# Patient Record
Sex: Female | Born: 1975
Health system: Southern US, Community
[De-identification: ages and names within clinical notes are randomized; demographics above are authoritative.]

## PROBLEM LIST (undated history)

## (undated) DIAGNOSIS — K589 Irritable bowel syndrome without diarrhea: Secondary | ICD-10-CM

## (undated) DIAGNOSIS — F329 Major depressive disorder, single episode, unspecified: Secondary | ICD-10-CM

## (undated) DIAGNOSIS — F32A Depression, unspecified: Secondary | ICD-10-CM

## (undated) DIAGNOSIS — J45909 Unspecified asthma, uncomplicated: Secondary | ICD-10-CM

## (undated) DIAGNOSIS — N189 Chronic kidney disease, unspecified: Secondary | ICD-10-CM

## (undated) DIAGNOSIS — K439 Ventral hernia without obstruction or gangrene: Secondary | ICD-10-CM

## (undated) DIAGNOSIS — R7303 Prediabetes: Secondary | ICD-10-CM

## (undated) DIAGNOSIS — Z87442 Personal history of urinary calculi: Secondary | ICD-10-CM

## (undated) DIAGNOSIS — E739 Lactose intolerance, unspecified: Secondary | ICD-10-CM

## (undated) HISTORY — PX: CHOLECYSTECTOMY: SHX55

## (undated) HISTORY — PX: LAPAROTOMY: SHX154

## (undated) HISTORY — DX: Irritable bowel syndrome without diarrhea: K58.9

## (undated) HISTORY — DX: Unspecified asthma, uncomplicated: J45.909

## (undated) HISTORY — DX: Lactose intolerance, unspecified: E73.9

---

## 2004-09-11 ENCOUNTER — Emergency Department (HOSPITAL_COMMUNITY): Admission: EM | Admit: 2004-09-11 | Discharge: 2004-09-11 | Payer: Self-pay | Admitting: Emergency Medicine

## 2014-06-17 ENCOUNTER — Other Ambulatory Visit (INDEPENDENT_AMBULATORY_CARE_PROVIDER_SITE_OTHER): Payer: Self-pay

## 2014-06-17 DIAGNOSIS — R1084 Generalized abdominal pain: Secondary | ICD-10-CM

## 2014-06-17 DIAGNOSIS — K432 Incisional hernia without obstruction or gangrene: Secondary | ICD-10-CM

## 2014-06-22 ENCOUNTER — Other Ambulatory Visit: Payer: Self-pay

## 2014-06-23 ENCOUNTER — Other Ambulatory Visit: Payer: Self-pay

## 2014-07-10 ENCOUNTER — Ambulatory Visit
Admission: RE | Admit: 2014-07-10 | Discharge: 2014-07-10 | Disposition: A | Discharge status: Home / Self Care | Payer: 59 | Source: Ambulatory Visit | Attending: General Surgery | Admitting: General Surgery

## 2014-07-10 DIAGNOSIS — R1084 Generalized abdominal pain: Secondary | ICD-10-CM

## 2014-07-10 DIAGNOSIS — K432 Incisional hernia without obstruction or gangrene: Secondary | ICD-10-CM

## 2014-07-10 MED ORDER — IOHEXOL 300 MG/ML  SOLN
125.0000 mL | Freq: Once | INTRAMUSCULAR | Status: AC | PRN
  Administered 2014-07-10: 125 mL via INTRAVENOUS

## 2014-07-30 ENCOUNTER — Other Ambulatory Visit (INDEPENDENT_AMBULATORY_CARE_PROVIDER_SITE_OTHER): Payer: Self-pay | Admitting: General Surgery

## 2014-07-30 ENCOUNTER — Other Ambulatory Visit: Payer: Self-pay | Admitting: General Surgery

## 2014-07-30 DIAGNOSIS — K219 Gastro-esophageal reflux disease without esophagitis: Secondary | ICD-10-CM

## 2014-07-30 DIAGNOSIS — R030 Elevated blood-pressure reading, without diagnosis of hypertension: Secondary | ICD-10-CM

## 2014-07-30 DIAGNOSIS — IMO0001 Reserved for inherently not codable concepts without codable children: Secondary | ICD-10-CM

## 2014-07-30 LAB — CBC WITH DIFFERENTIAL/PLATELET
BASOS ABS: 0 10*3/uL (ref 0.0–0.1)
Basophils Relative: 0 % (ref 0–1)
EOS PCT: 3 % (ref 0–5)
Eosinophils Absolute: 0.3 10*3/uL (ref 0.0–0.7)
HCT: 36.6 % (ref 36.0–46.0)
Hemoglobin: 12 g/dL (ref 12.0–15.0)
LYMPHS ABS: 3.4 10*3/uL (ref 0.7–4.0)
LYMPHS PCT: 36 % (ref 12–46)
MCH: 28.2 pg (ref 26.0–34.0)
MCHC: 32.8 g/dL (ref 30.0–36.0)
MCV: 86.1 fL (ref 78.0–100.0)
MONO ABS: 1 10*3/uL (ref 0.1–1.0)
MPV: 9 fL (ref 8.6–12.4)
Monocytes Relative: 10 % (ref 3–12)
Neutro Abs: 4.8 10*3/uL (ref 1.7–7.7)
Neutrophils Relative %: 51 % (ref 43–77)
Platelets: 377 10*3/uL (ref 150–400)
RBC: 4.25 MIL/uL (ref 3.87–5.11)
RDW: 14.7 % (ref 11.5–15.5)
WBC: 9.5 10*3/uL (ref 4.0–10.5)

## 2014-08-03 LAB — VITAMIN D 1,25 DIHYDROXY
VITAMIN D 1, 25 (OH) TOTAL: 31 pg/mL (ref 18–72)
Vitamin D2 1, 25 (OH)2: <8 pg/mL
Vitamin D3 1, 25 (OH)2: 31 pg/mL

## 2014-08-03 LAB — GLYCOHEMOGLOBIN, TOTAL: Glycohemoglobin (GHb),Total: 6.9 % (ref 5.4–7.4)

## 2014-08-04 ENCOUNTER — Other Ambulatory Visit: Payer: Self-pay

## 2014-08-06 LAB — H PYLORI, IGM, IGG, IGA AB

## 2014-08-24 ENCOUNTER — Encounter: Payer: Self-pay | Admitting: Dietician

## 2014-08-24 ENCOUNTER — Encounter: Payer: 59 | Attending: General Surgery | Admitting: Dietician

## 2014-08-24 DIAGNOSIS — Z6841 Body Mass Index (BMI) 40.0 and over, adult: Secondary | ICD-10-CM | POA: Insufficient documentation

## 2014-08-24 DIAGNOSIS — Z713 Dietary counseling and surveillance: Secondary | ICD-10-CM | POA: Insufficient documentation

## 2014-08-24 NOTE — Patient Instructions (Signed)

## 2014-08-24 NOTE — Progress Notes (Signed)
  Pre-Op Assessment Visit:  Pre-Operative Sleeve Gastrectomy Surgery  Medical Nutrition Therapy:  Appt start time: 0800   End time:  0845.  Patient was seen on 08/24/2014 for Pre-Operative Nutrition Assessment. Assessment and [facility] on 08/24/2014.   Preferred Learning Style:   No preference indicated   Learning Readiness:   Ready  Handouts given during visit include:  Pre-Op Goals Bariatric Surgery Protein Shakes   During the appointment today the following Pre-Op Goals were reviewed with the patient: Maintain or lose weight as instructed by your surgeon Make healthy food choices Begin to limit portion sizes Limited concentrated sugars and fried foods Keep fat/sugar in the single digits per serving on   food labels Practice CHEWING your food  (aim for 30 chews per bite or until applesauce consistency) Practice not drinking 15 minutes before, during, and 30 minutes after each meal/snack Avoid all carbonated beverages  Avoid/limit caffeinated beverages  Avoid all sugar-sweetened beverages Consume 3 meals per day; eat every 3-5 hours Make a list of non-food related activities Aim for 64-100 ounces of FLUID daily  Aim for at least 60-80 grams of PROTEIN daily Look for a liquid protein source that contain ?15 g protein and ?5 g carbohydrate  (ex: shakes, drinks, shots)  Patient-Centered Goals: Leslie Cohen would like to have less foot pain, have more energy, and be more comfortable in airplane and on rollercoasters.  Leslie Cohen feels like it is very important to follow the pre op goals and feels very confident that she will be able to follow the pre op goals.  Demonstrated degree of understanding via:  Teach Back  Teaching Method Utilized:  Visual Auditory Hands on  Barriers to learning/adherence to lifestyle change: IBS, lactose intolerance  Patient to call the [facility] to enroll in Pre-Op and Post-Op Nutrition Education when surgery date is scheduled.

## 2014-09-07 ENCOUNTER — Ambulatory Visit (HOSPITAL_COMMUNITY)
Admission: RE | Admit: 2014-09-07 | Discharge: 2014-09-07 | Disposition: A | Discharge status: Home / Self Care | Payer: 59 | Source: Ambulatory Visit | Attending: General Surgery | Admitting: General Surgery

## 2014-09-07 ENCOUNTER — Other Ambulatory Visit: Payer: Self-pay

## 2014-09-07 DIAGNOSIS — N200 Calculus of kidney: Secondary | ICD-10-CM | POA: Insufficient documentation

## 2014-09-07 DIAGNOSIS — J45909 Unspecified asthma, uncomplicated: Secondary | ICD-10-CM | POA: Insufficient documentation

## 2014-09-07 DIAGNOSIS — E739 Lactose intolerance, unspecified: Secondary | ICD-10-CM | POA: Insufficient documentation

## 2014-09-07 DIAGNOSIS — K589 Irritable bowel syndrome without diarrhea: Secondary | ICD-10-CM | POA: Diagnosis not present

## 2014-09-07 DIAGNOSIS — Z6841 Body Mass Index (BMI) 40.0 and over, adult: Secondary | ICD-10-CM | POA: Insufficient documentation

## 2014-09-07 DIAGNOSIS — Z9049 Acquired absence of other specified parts of digestive tract: Secondary | ICD-10-CM | POA: Diagnosis not present

## 2014-09-07 DIAGNOSIS — K219 Gastro-esophageal reflux disease without esophagitis: Secondary | ICD-10-CM

## 2014-10-06 ENCOUNTER — Ambulatory Visit: Payer: Self-pay | Admitting: General Surgery

## 2014-10-19 ENCOUNTER — Encounter: Payer: 59 | Attending: General Surgery

## 2014-10-19 DIAGNOSIS — Z713 Dietary counseling and surveillance: Secondary | ICD-10-CM | POA: Diagnosis not present

## 2014-10-19 DIAGNOSIS — Z6841 Body Mass Index (BMI) 40.0 and over, adult: Secondary | ICD-10-CM | POA: Diagnosis not present

## 2014-10-20 NOTE — Progress Notes (Signed)
  Pre-Operative Nutrition Class:  Appt start time: 830   End time:  930.  Patient was seen on 10/19/14 for Pre-Operative Bariatric Surgery Education at the [facility].   Surgery date: 11/02/14 Surgery type: Gastric sleeve Start weight at [facility]: 243.5 lbs on 08/24/14 Weight today: 241.5 lbs  TANITA  BODY COMP RESULTS  10/19/14   BMI (kg/m^2) 45.6   Fat Mass (lbs) 121.5   Fat Free Mass (lbs) 120   Total Body Water (lbs) 88   Samples given per MNT protocol. Patient educated on appropriate usage: PB2 (chocolate - qty 1) Lot #: None Exp: 11/16  Premier protein shake (strawberry - qty 1) Lot #: [id number] Exp: 9/16  Bariatric Advantage calcium citrate chew (caramel - qty 1) Lot #: [id number] Exp: 5/16  The following the learning objectives were met by the patient during this course:  Identify Pre-Op Dietary Goals and will begin 2 weeks pre-operatively  Identify appropriate sources of fluids and proteins   State protein recommendations and appropriate sources pre and post-operatively  Identify Post-Operative Dietary Goals and will follow for 2 weeks post-operatively  Identify appropriate multivitamin and calcium sources  Describe the need for physical activity post-operatively and will follow MD recommendations  State when to call healthcare provider regarding medication questions or post-operative complications  Handouts given during class include:  Pre-Op Bariatric Surgery Diet Handout  Protein Shake Handout  Post-Op Bariatric Surgery Nutrition Handout  BELT Program Information Flyer  Support Group Information Flyer  [facility] Bariatric Supplements Price List  Follow-Up Plan: Patient will follow-up at [facility] 2 weeks post operatively for diet advancement per MD.

## 2014-10-23 ENCOUNTER — Encounter (HOSPITAL_COMMUNITY): Payer: Self-pay

## 2014-10-23 ENCOUNTER — Encounter (HOSPITAL_COMMUNITY)
Admission: RE | Admit: 2014-10-23 | Discharge: 2014-10-23 | Disposition: A | Discharge status: Home / Self Care | Payer: 59 | Source: Ambulatory Visit | Attending: General Surgery | Admitting: General Surgery

## 2014-10-23 DIAGNOSIS — Z01818 Encounter for other preprocedural examination: Secondary | ICD-10-CM | POA: Insufficient documentation

## 2014-10-23 HISTORY — DX: Depression, unspecified: F32.A

## 2014-10-23 HISTORY — DX: Chronic kidney disease, unspecified: N18.9

## 2014-10-23 HISTORY — DX: Major depressive disorder, single episode, unspecified: F32.9

## 2014-10-23 LAB — CBC WITH DIFFERENTIAL/PLATELET
Basophils Absolute: 0 10*3/uL (ref 0.0–0.1)
Basophils Relative: 0 % (ref 0–1)
Eosinophils Absolute: 0.3 10*3/uL (ref 0.0–0.7)
Eosinophils Relative: 3 % (ref 0–5)
HEMATOCRIT: 36 % (ref 36.0–46.0)
HEMOGLOBIN: 11.7 g/dL — AB (ref 12.0–15.0)
LYMPHS ABS: 2.7 10*3/uL (ref 0.7–4.0)
Lymphocytes Relative: 30 % (ref 12–46)
MCH: 27.9 pg (ref 26.0–34.0)
MCHC: 32.5 g/dL (ref 30.0–36.0)
MCV: 85.9 fL (ref 78.0–100.0)
Monocytes Absolute: 0.8 10*3/uL (ref 0.1–1.0)
Monocytes Relative: 9 % (ref 3–12)
Neutro Abs: 5.3 10*3/uL (ref 1.7–7.7)
Neutrophils Relative %: 58 % (ref 43–77)
Platelets: 350 10*3/uL (ref 150–400)
RBC: 4.19 MIL/uL (ref 3.87–5.11)
RDW: 13 % (ref 11.5–15.5)
WBC: 9 10*3/uL (ref 4.0–10.5)

## 2014-10-23 LAB — COMPREHENSIVE METABOLIC PANEL
ALK PHOS: 84 U/L (ref 39–117)
ALT: 17 U/L (ref 0–35)
ANION GAP: 6 (ref 5–15)
AST: 19 U/L (ref 0–37)
Albumin: 3.7 g/dL (ref 3.5–5.2)
BUN: 15 mg/dL (ref 6–23)
CO2: 26 mmol/L (ref 19–32)
Calcium: 8.5 mg/dL (ref 8.4–10.5)
Chloride: 105 mmol/L (ref 96–112)
Creatinine, Ser: 0.55 mg/dL (ref 0.50–1.10)
GFR calc Af Amer: >90 mL/min (ref >90)
GFR calc non Af Amer: >90 mL/min (ref >90)
Glucose, Bld: 101 mg/dL — ABNORMAL HIGH (ref 70–99)
Potassium: 3.5 mmol/L (ref 3.5–5.1)
Sodium: 137 mmol/L (ref 135–145)
TOTAL PROTEIN: 7.2 g/dL (ref 6.0–8.3)

## 2014-10-23 LAB — HCG, SERUM, QUALITATIVE: Preg, Serum: NEGATIVE

## 2014-10-23 NOTE — Progress Notes (Signed)
CXR per epic 09/07/2014  EKG per epic 09/07/2014

## 2014-10-23 NOTE — Patient Instructions (Signed)
20 Leslie Cohen  10/23/2014   Your procedure is scheduled on:   Monday Nov 02, 2014  [facility] and follow signs to  Short Stay Center arrive at 10:30 AM.   Call this number if you have problems the morning of surgery [phone] or Presurgical Testing [phone].   Remember:  Do not eat food After Midnight but may have clear liquids till 6:30 am day of surgery then nothing by mouth.    Take these medicines the morning of surgery with A SIP OF WATER: NONE                               You may not have any metal on your body including hair pins and piercings  Do not wear jewelry, make-up, lotions, powders, prefumes or deodorant or nail polish.  Do not shave body hair  48 hours(2 days) of CHG soap use.                Do not bring valuables to the hospital. [facility] IS NOT RESPONSIBLE FOR VALUABLES.  Contacts, dentures or bridgework may not be worn into surgery.  Leave suitcase in the car. After surgery it may be brought to your room.  For patients admitted to the hospital, checkout time is 11:00 AM the day of discharge.    [facility] - Preparing for Surgery Before surgery, you can play an important role.  Because skin is not sterile, your skin needs to be as free of germs as possible.  You can reduce the number of germs on your skin by washing with CHG (chlorahexidine gluconate) soap before surgery.  CHG is an antiseptic cleaner which kills germs and bonds with the skin to continue killing germs even after washing. Please DO NOT use if you have an allergy to CHG or antibacterial soaps.  If your skin becomes reddened/irritated stop using the CHG and inform your nurse when you arrive at Short Stay. Do not shave (including legs and underarms) for at least 48 hours prior to the first CHG shower.  You may shave your face/neck. Please follow these instructions carefully:  1.  Shower with CHG Soap the night before surgery and the  morning of Surgery.  2.   If you choose to wash your hair, wash your hair first as usual with your  normal  shampoo.  3.  After you shampoo, rinse your hair and body thoroughly to remove the  shampoo.                           4.  Use CHG as you would any other liquid soap.  You can apply chg directly  to the skin and wash                       Gently with a scrungie or clean washcloth.  5.  Apply the CHG Soap to your body ONLY FROM THE NECK DOWN.   Do not use on face/ open                           Wound or open sores. Avoid contact with eyes, ears mouth and genitals (private parts).                       Wash  face,  Genitals (private parts) with your normal soap.             6.  Wash thoroughly, paying special attention to the area where your surgery  will be performed.  7.  Thoroughly rinse your body with warm water from the neck down.  8.  DO NOT shower/wash with your normal soap after using and rinsing off  the CHG Soap.                9.  Pat yourself dry with a clean towel.            10.  Wear clean pajamas.            11.  Place clean sheets on your bed the night of your first shower and do not  sleep with pets. Day of Surgery : Do not apply any lotions/deodorants the morning of surgery.  Please wear clean clothes to the hospital/surgery center.  FAILURE TO FOLLOW THESE INSTRUCTIONS MAY RESULT IN THE CANCELLATION OF YOUR SURGERY PATIENT SIGNATURE_________________________________  NURSE SIGNATURE__________________________________  ________________________________________________________________________    CLEAR LIQUID DIET   Foods Allowed                                                                     Foods Excluded  Coffee and tea, regular and decaf                             liquids that you cannot  Plain Jell-O in any flavor                                             see through such as: Fruit ices (not with fruit pulp)                                     milk, soups, orange juice  Iced  Popsicles                                    All solid food Carbonated beverages, regular and diet                                    Cranberry, grape and apple juices Sports drinks like Gatorade Lightly seasoned clear broth or consume(fat free) Sugar, honey syrup  Sample Menu Breakfast                                Lunch                                     Supper Cranberry juice  Beef broth                            Chicken broth Jell-O                                     Grape juice                           Apple juice Coffee or tea                        Jell-O                                      Popsicle                                                Coffee or tea                        Coffee or tea  _____________________________________________________________________

## 2014-11-02 ENCOUNTER — Encounter (HOSPITAL_COMMUNITY): Payer: Self-pay | Admitting: *Deleted

## 2014-11-02 ENCOUNTER — Inpatient Hospital Stay (HOSPITAL_COMMUNITY)
Admission: RE | Admit: 2014-11-02 | Discharge: 2014-11-05 | DRG: 621 | Disposition: A | Discharge status: Home / Self Care | Payer: 59 | Source: Ambulatory Visit | Attending: General Surgery | Admitting: General Surgery

## 2014-11-02 ENCOUNTER — Encounter (HOSPITAL_COMMUNITY)
Admission: RE | Disposition: A | Discharge status: Home / Self Care | Payer: Self-pay | Source: Ambulatory Visit | Attending: General Surgery

## 2014-11-02 ENCOUNTER — Inpatient Hospital Stay (HOSPITAL_COMMUNITY): Payer: 59 | Admitting: Anesthesiology

## 2014-11-02 DIAGNOSIS — R7309 Other abnormal glucose: Secondary | ICD-10-CM | POA: Diagnosis present

## 2014-11-02 DIAGNOSIS — Z79899 Other long term (current) drug therapy: Secondary | ICD-10-CM | POA: Diagnosis not present

## 2014-11-02 DIAGNOSIS — K449 Diaphragmatic hernia without obstruction or gangrene: Secondary | ICD-10-CM | POA: Diagnosis present

## 2014-11-02 DIAGNOSIS — K210 Gastro-esophageal reflux disease with esophagitis: Secondary | ICD-10-CM | POA: Diagnosis present

## 2014-11-02 DIAGNOSIS — K219 Gastro-esophageal reflux disease without esophagitis: Secondary | ICD-10-CM | POA: Diagnosis present

## 2014-11-02 DIAGNOSIS — Z6841 Body Mass Index (BMI) 40.0 and over, adult: Secondary | ICD-10-CM

## 2014-11-02 DIAGNOSIS — Z9049 Acquired absence of other specified parts of digestive tract: Secondary | ICD-10-CM | POA: Diagnosis present

## 2014-11-02 DIAGNOSIS — J45909 Unspecified asthma, uncomplicated: Secondary | ICD-10-CM | POA: Diagnosis present

## 2014-11-02 DIAGNOSIS — Z8249 Family history of ischemic heart disease and other diseases of the circulatory system: Secondary | ICD-10-CM | POA: Diagnosis not present

## 2014-11-02 DIAGNOSIS — R110 Nausea: Secondary | ICD-10-CM

## 2014-11-02 DIAGNOSIS — F329 Major depressive disorder, single episode, unspecified: Secondary | ICD-10-CM | POA: Diagnosis present

## 2014-11-02 DIAGNOSIS — K432 Incisional hernia without obstruction or gangrene: Secondary | ICD-10-CM | POA: Diagnosis present

## 2014-11-02 DIAGNOSIS — Z9884 Bariatric surgery status: Secondary | ICD-10-CM

## 2014-11-02 DIAGNOSIS — Z01812 Encounter for preprocedural laboratory examination: Secondary | ICD-10-CM

## 2014-11-02 DIAGNOSIS — Z811 Family history of alcohol abuse and dependence: Secondary | ICD-10-CM | POA: Diagnosis not present

## 2014-11-02 DIAGNOSIS — R7303 Prediabetes: Secondary | ICD-10-CM | POA: Diagnosis present

## 2014-11-02 HISTORY — PX: LAPAROSCOPIC GASTRIC SLEEVE RESECTION: SHX5895

## 2014-11-02 LAB — HEMOGLOBIN AND HEMATOCRIT, BLOOD
HEMATOCRIT: 37.9 % (ref 36.0–46.0)
HEMOGLOBIN: 12.6 g/dL (ref 12.0–15.0)

## 2014-11-02 LAB — PREGNANCY, URINE: Preg Test, Ur: NEGATIVE

## 2014-11-02 SURGERY — GASTRECTOMY, SLEEVE, LAPAROSCOPIC
Anesthesia: General | Site: Abdomen

## 2014-11-02 MED ORDER — SODIUM CHLORIDE 0.9 % IJ SOLN
INTRAMUSCULAR | Status: AC
  Filled 2014-11-02: qty 50

## 2014-11-02 MED ORDER — PROPOFOL 10 MG/ML IV BOLUS
INTRAVENOUS | Status: AC
  Filled 2014-11-02: qty 20

## 2014-11-02 MED ORDER — MORPHINE SULFATE 2 MG/ML IJ SOLN
2.0000 mg | INTRAMUSCULAR | Status: DC | PRN
  Administered 2014-11-02 (×3): 4 mg via INTRAVENOUS
  Administered 2014-11-03 (×4): 2 mg via INTRAVENOUS
  Administered 2014-11-03: 4 mg via INTRAVENOUS
  Administered 2014-11-03 (×2): 2 mg via INTRAVENOUS
  Filled 2014-11-02 (×3): qty 2
  Filled 2014-11-02: qty 1
  Filled 2014-11-02: qty 2
  Filled 2014-11-02 (×2): qty 1
  Filled 2014-11-02: qty 2

## 2014-11-02 MED ORDER — DEXAMETHASONE SODIUM PHOSPHATE 10 MG/ML IJ SOLN
INTRAMUSCULAR | Status: DC | PRN
  Administered 2014-11-02: 10 mg via INTRAVENOUS

## 2014-11-02 MED ORDER — ACETAMINOPHEN 160 MG/5ML PO SOLN
325.0000 mg | ORAL | Status: DC | PRN
Start: 2014-11-03 — End: 2014-11-05

## 2014-11-02 MED ORDER — PROMETHAZINE HCL 25 MG/ML IJ SOLN
6.2500 mg | INTRAMUSCULAR | Status: DC | PRN
  Administered 2014-11-02: 6.25 mg via INTRAVENOUS

## 2014-11-02 MED ORDER — LACTATED RINGERS IV SOLN
INTRAVENOUS | Status: DC
  Administered 2014-11-02: 16:00:00 via INTRAVENOUS
  Administered 2014-11-02: 1000 mL via INTRAVENOUS

## 2014-11-02 MED ORDER — UNJURY CHICKEN SOUP POWDER
2.0000 [oz_av] | Freq: Four times a day (QID) | ORAL | Status: DC
  Administered 2014-11-04: 2 [oz_av] via ORAL

## 2014-11-02 MED ORDER — PROMETHAZINE HCL 25 MG/ML IJ SOLN: 12.5000 mg | Freq: Four times a day (QID) | INTRAMUSCULAR | Status: DC | PRN

## 2014-11-02 MED ORDER — ONDANSETRON HCL 4 MG/2ML IJ SOLN: 4.0000 mg | Freq: Once | INTRAMUSCULAR | Status: DC | PRN

## 2014-11-02 MED ORDER — NEOSTIGMINE METHYLSULFATE 10 MG/10ML IV SOLN
INTRAVENOUS | Status: DC | PRN
  Administered 2014-11-02: 4 mg via INTRAVENOUS

## 2014-11-02 MED ORDER — UNJURY CHOCOLATE CLASSIC POWDER
2.0000 [oz_av] | Freq: Four times a day (QID) | ORAL | Status: DC
  Administered 2014-11-04: 2 [oz_av] via ORAL

## 2014-11-02 MED ORDER — HYDROMORPHONE HCL 1 MG/ML IJ SOLN: 0.5000 mg | INTRAMUSCULAR | Status: DC | PRN

## 2014-11-02 MED ORDER — ONDANSETRON HCL 4 MG/2ML IJ SOLN
INTRAMUSCULAR | Status: AC
  Filled 2014-11-02: qty 2

## 2014-11-02 MED ORDER — SUFENTANIL CITRATE 50 MCG/ML IV SOLN
INTRAVENOUS | Status: DC | PRN
  Administered 2014-11-02: 10 ug via INTRAVENOUS
  Administered 2014-11-02: 5 ug via INTRAVENOUS
  Administered 2014-11-02: 20 ug via INTRAVENOUS
  Administered 2014-11-02: 10 ug via INTRAVENOUS
  Administered 2014-11-02: 5 ug via INTRAVENOUS

## 2014-11-02 MED ORDER — PANTOPRAZOLE SODIUM 40 MG IV SOLR
40.0000 mg | Freq: Every day | INTRAVENOUS | Status: DC
  Administered 2014-11-02 – 2014-11-04 (×3): 40 mg via INTRAVENOUS
  Filled 2014-11-02 (×4): qty 40

## 2014-11-02 MED ORDER — GLYCOPYRROLATE 0.2 MG/ML IJ SOLN
INTRAMUSCULAR | Status: DC | PRN
Start: 2014-11-02 — End: 2014-11-02
  Administered 2014-11-02: 0.6 mg via INTRAVENOUS

## 2014-11-02 MED ORDER — SODIUM CHLORIDE 0.9 % IJ SOLN
INTRAMUSCULAR | Status: AC
  Filled 2014-11-02: qty 10

## 2014-11-02 MED ORDER — PROPOFOL 10 MG/ML IV BOLUS
INTRAVENOUS | Status: DC | PRN
  Administered 2014-11-02: 200 mg via INTRAVENOUS

## 2014-11-02 MED ORDER — SUFENTANIL CITRATE 50 MCG/ML IV SOLN
INTRAVENOUS | Status: AC
  Filled 2014-11-02: qty 1

## 2014-11-02 MED ORDER — LIDOCAINE HCL (CARDIAC) 20 MG/ML IV SOLN
INTRAVENOUS | Status: DC | PRN
  Administered 2014-11-02: 100 mg via INTRAVENOUS

## 2014-11-02 MED ORDER — ONDANSETRON HCL 4 MG/2ML IJ SOLN
4.0000 mg | Freq: Once | INTRAMUSCULAR | Status: AC | PRN
  Administered 2014-11-02: 4 mg via INTRAVENOUS

## 2014-11-02 MED ORDER — DEXAMETHASONE SODIUM PHOSPHATE 10 MG/ML IJ SOLN
INTRAMUSCULAR | Status: AC
  Filled 2014-11-02: qty 1

## 2014-11-02 MED ORDER — ENOXAPARIN SODIUM 30 MG/0.3ML ~~LOC~~ SOLN
30.0000 mg | Freq: Two times a day (BID) | SUBCUTANEOUS | Status: DC
  Administered 2014-11-03 – 2014-11-04 (×4): 30 mg via SUBCUTANEOUS
  Filled 2014-11-02 (×7): qty 0.3

## 2014-11-02 MED ORDER — CHLORHEXIDINE GLUCONATE 4 % EX LIQD: 60.0000 mL | Freq: Once | CUTANEOUS | Status: DC

## 2014-11-02 MED ORDER — ONDANSETRON HCL 4 MG/2ML IJ SOLN
INTRAMUSCULAR | Status: DC | PRN
Start: 2014-11-02 — End: 2014-11-02
  Administered 2014-11-02: 4 mg via INTRAVENOUS

## 2014-11-02 MED ORDER — ONDANSETRON HCL 4 MG/2ML IJ SOLN
4.0000 mg | INTRAMUSCULAR | Status: DC | PRN
  Administered 2014-11-03 – 2014-11-04 (×3): 4 mg via INTRAVENOUS
  Filled 2014-11-02 (×3): qty 2

## 2014-11-02 MED ORDER — MIDAZOLAM HCL 2 MG/2ML IJ SOLN
INTRAMUSCULAR | Status: AC
  Filled 2014-11-02: qty 2

## 2014-11-02 MED ORDER — HYDROMORPHONE HCL 2 MG/ML IJ SOLN
INTRAMUSCULAR | Status: AC
  Filled 2014-11-02: qty 1

## 2014-11-02 MED ORDER — CISATRACURIUM BESYLATE (PF) 10 MG/5ML IV SOLN
INTRAVENOUS | Status: DC | PRN
  Administered 2014-11-02: 12 mg via INTRAVENOUS
  Administered 2014-11-02: 2 mg via INTRAVENOUS
  Administered 2014-11-02: 1 mg via INTRAVENOUS

## 2014-11-02 MED ORDER — HYDROMORPHONE HCL 1 MG/ML IJ SOLN
0.2500 mg | INTRAMUSCULAR | Status: DC | PRN
  Administered 2014-11-02 (×2): 0.5 mg via INTRAVENOUS

## 2014-11-02 MED ORDER — PROMETHAZINE HCL 25 MG/ML IJ SOLN
INTRAMUSCULAR | Status: AC
  Filled 2014-11-02: qty 1

## 2014-11-02 MED ORDER — BUPIVACAINE LIPOSOME 1.3 % IJ SUSP
20.0000 mL | Freq: Once | INTRAMUSCULAR | Status: AC
  Administered 2014-11-02: 20 mL
  Filled 2014-11-02: qty 20

## 2014-11-02 MED ORDER — MIDAZOLAM HCL 5 MG/5ML IJ SOLN
INTRAMUSCULAR | Status: DC | PRN
  Administered 2014-11-02: 2 mg via INTRAVENOUS

## 2014-11-02 MED ORDER — OXYCODONE HCL 5 MG/5ML PO SOLN
5.0000 mg | ORAL | Status: DC | PRN
  Administered 2014-11-03 – 2014-11-04 (×3): 5 mg via ORAL
  Filled 2014-11-02 (×3): qty 5

## 2014-11-02 MED ORDER — UNJURY VANILLA POWDER
2.0000 [oz_av] | Freq: Four times a day (QID) | ORAL | Status: DC
  Administered 2014-11-04: 2 [oz_av] via ORAL

## 2014-11-02 MED ORDER — HEPARIN SODIUM (PORCINE) 5000 UNIT/ML IJ SOLN
5000.0000 [IU] | INTRAMUSCULAR | Status: AC
  Administered 2014-11-02: 5000 [IU] via SUBCUTANEOUS
  Filled 2014-11-02: qty 1

## 2014-11-02 MED ORDER — KCL IN DEXTROSE-NACL 20-5-0.45 MEQ/L-%-% IV SOLN
INTRAVENOUS | Status: DC
  Administered 2014-11-02 – 2014-11-03 (×2): via INTRAVENOUS
  Administered 2014-11-03: 125 mL/h via INTRAVENOUS
  Administered 2014-11-04 (×2): via INTRAVENOUS
  Administered 2014-11-05: 1000 mL via INTRAVENOUS
  Filled 2014-11-02 (×10): qty 1000

## 2014-11-02 MED ORDER — HYDROMORPHONE HCL 1 MG/ML IJ SOLN
INTRAMUSCULAR | Status: AC
  Filled 2014-11-02: qty 1

## 2014-11-02 MED ORDER — CISATRACURIUM BESYLATE 20 MG/10ML IV SOLN
INTRAVENOUS | Status: AC
  Filled 2014-11-02: qty 10

## 2014-11-02 MED ORDER — MORPHINE SULFATE 4 MG/ML IJ SOLN
INTRAMUSCULAR | Status: AC
  Filled 2014-11-02: qty 1

## 2014-11-02 MED ORDER — CEFOTETAN DISODIUM-DEXTROSE 2-2.08 GM-% IV SOLR
INTRAVENOUS | Status: AC
  Filled 2014-11-02: qty 50

## 2014-11-02 MED ORDER — ACETAMINOPHEN 160 MG/5ML PO SOLN
650.0000 mg | ORAL | Status: DC | PRN
  Administered 2014-11-04: 650 mg via ORAL
  Filled 2014-11-02: qty 20.3

## 2014-11-02 MED ORDER — LIDOCAINE HCL (CARDIAC) 20 MG/ML IV SOLN
INTRAVENOUS | Status: AC
  Filled 2014-11-02: qty 5

## 2014-11-02 MED ORDER — HYDROMORPHONE HCL 1 MG/ML IJ SOLN
INTRAMUSCULAR | Status: DC | PRN
  Administered 2014-11-02: 1 mg via INTRAVENOUS
  Administered 2014-11-02 (×2): 0.5 mg via INTRAVENOUS

## 2014-11-02 MED ORDER — SUCCINYLCHOLINE CHLORIDE 20 MG/ML IJ SOLN
INTRAMUSCULAR | Status: DC | PRN
  Administered 2014-11-02: 100 mg via INTRAVENOUS

## 2014-11-02 MED ORDER — METHOCARBAMOL 1000 MG/10ML IJ SOLN
1000.0000 mg | Freq: Three times a day (TID) | INTRAMUSCULAR | Status: DC
Start: 2014-11-02 — End: 2014-11-05
  Administered 2014-11-02 – 2014-11-04 (×6): 1000 mg via INTRAVENOUS
  Filled 2014-11-02 (×14): qty 10

## 2014-11-02 MED ORDER — EPHEDRINE SULFATE 50 MG/ML IJ SOLN
INTRAMUSCULAR | Status: AC
  Filled 2014-11-02: qty 1

## 2014-11-02 MED ORDER — CEFOTETAN DISODIUM-DEXTROSE 2-2.08 GM-% IV SOLR
2.0000 g | INTRAVENOUS | Status: AC
  Administered 2014-11-02: 2 g via INTRAVENOUS

## 2014-11-02 SURGICAL SUPPLY — 81 items
APL SRG 32X5 SNPLK LF DISP (MISCELLANEOUS) ×2
APPLICATOR COTTON TIP 6IN STRL (MISCELLANEOUS) ×4 IMPLANT
APPLIER CLIP ROT 10 11.4 M/L (STAPLE)
APR CLP MED LRG 11.4X10 (STAPLE)
BINDER ABDOMINAL 12 ML 46-62 (SOFTGOODS) ×3 IMPLANT
BLADE EXTENDED COATED 6.5IN (ELECTRODE) IMPLANT
BLADE HEX COATED 2.75 (ELECTRODE) ×4 IMPLANT
BLADE SURG SZ11 CARB STEEL (BLADE) ×4 IMPLANT
CABLE HIGH FREQUENCY MONO STRZ (ELECTRODE) IMPLANT
CHLORAPREP W/TINT 26ML (MISCELLANEOUS) ×8 IMPLANT
CLIP APPLIE ROT 10 11.4 M/L (STAPLE) IMPLANT
DEVICE SUT QUICK LOAD TK 5 (STAPLE) IMPLANT
DEVICE SUT TI-KNOT TK 5X26 (MISCELLANEOUS) IMPLANT
DEVICE SUTURE ENDOST 10MM (ENDOMECHANICALS) IMPLANT
DEVICE TI KNOT TK5 (MISCELLANEOUS)
DEVICE TROCAR PUNCTURE CLOSURE (ENDOMECHANICALS) ×4 IMPLANT
DRAPE CAMERA CLOSED 9X96 (DRAPES) ×4 IMPLANT
DRAPE LAPAROSCOPIC ABDOMINAL (DRAPES) ×4 IMPLANT
DRAPE UTILITY XL STRL (DRAPES) ×8 IMPLANT
ELECT REM PT RETURN 9FT ADLT (ELECTROSURGICAL) ×4
ELECTRODE REM PT RTRN 9FT ADLT (ELECTROSURGICAL) ×2 IMPLANT
GAUZE SPONGE 4X4 12PLY STRL (GAUZE/BANDAGES/DRESSINGS) IMPLANT
GLOVE BIOGEL M STRL SZ7.5 (GLOVE) ×4 IMPLANT
GLOVE BIOGEL PI IND STRL 7.0 (GLOVE) ×2 IMPLANT
GLOVE BIOGEL PI INDICATOR 7.0 (GLOVE) ×2
GLOVE ECLIPSE 7.5 STRL STRAW (GLOVE) ×4 IMPLANT
GLOVE INDICATOR 8.0 STRL GRN (GLOVE) ×4 IMPLANT
GOWN STRL REUS W/TWL LRG LVL3 (GOWN DISPOSABLE) ×4 IMPLANT
GOWN STRL REUS W/TWL XL LVL3 (GOWN DISPOSABLE) ×12 IMPLANT
HOVERMATT SINGLE USE (MISCELLANEOUS) ×4 IMPLANT
KIT BASIN OR (CUSTOM PROCEDURE TRAY) ×4 IMPLANT
LIQUID BAND (GAUZE/BANDAGES/DRESSINGS) ×4 IMPLANT
NDL SPNL 22GX3.5 QUINCKE BK (NEEDLE) ×1 IMPLANT
NEEDLE SPNL 22GX3.5 QUINCKE BK (NEEDLE) ×4 IMPLANT
NS IRRIG 1000ML POUR BTL (IV SOLUTION) ×4 IMPLANT
PACK GENERAL/GYN (CUSTOM PROCEDURE TRAY) ×4 IMPLANT
PACK UNIVERSAL I (CUSTOM PROCEDURE TRAY) ×4 IMPLANT
PEN SKIN MARKING BROAD (MISCELLANEOUS) ×4 IMPLANT
QUICK LOAD TK 5 (STAPLE)
RELOAD STAPLE 60 3.6 BLU REG (STAPLE) IMPLANT
RELOAD STAPLE 60 3.8 GOLD REG (STAPLE) IMPLANT
RELOAD STAPLE 60 4.1 GRN THCK (STAPLE) IMPLANT
RELOAD STAPLER BLUE 60MM (STAPLE) ×2 IMPLANT
RELOAD STAPLER GOLD 60MM (STAPLE) IMPLANT
RELOAD STAPLER GREEN 60MM (STAPLE) ×6 IMPLANT
SCISSORS LAP 5X35 DISP (ENDOMECHANICALS) IMPLANT
SCISSORS LAP 5X45 EPIX DISP (ENDOMECHANICALS) ×4 IMPLANT
SEALANT SURGICAL APPL DUAL CAN (MISCELLANEOUS) ×4 IMPLANT
SET IRRIG TUBING LAPAROSCOPIC (IRRIGATION / IRRIGATOR) ×4 IMPLANT
SHEARS CURVED HARMONIC AC 45CM (MISCELLANEOUS) ×4 IMPLANT
SLEEVE ADV FIXATION 5X100MM (TROCAR) ×8 IMPLANT
SLEEVE GASTRECTOMY 36FR VISIGI (MISCELLANEOUS) ×4 IMPLANT
SLEEVE XCEL OPT CAN 5 100 (ENDOMECHANICALS) IMPLANT
SOLUTION ANTI FOG 6CC (MISCELLANEOUS) ×4 IMPLANT
SPONGE LAP 18X18 X RAY DECT (DISPOSABLE) ×4 IMPLANT
STAPLER ECHELON BIOABSB 60 FLE (MISCELLANEOUS) ×6 IMPLANT
STAPLER ECHELON LONG 60 440 (INSTRUMENTS) IMPLANT
STAPLER RELOAD BLUE 60MM (STAPLE) ×4
STAPLER RELOAD GOLD 60MM (STAPLE)
STAPLER RELOAD GREEN 60MM (STAPLE) ×12
STAPLER VISISTAT 35W (STAPLE) ×4 IMPLANT
SUT MNCRL AB 4-0 PS2 18 (SUTURE) ×4 IMPLANT
SUT PROLENE 0 CT 2 (SUTURE) ×4 IMPLANT
SUT SURGIDAC NAB ES-9 0 48 120 (SUTURE) IMPLANT
SUT VIC AB 3-0 SH 27 (SUTURE) ×4
SUT VIC AB 3-0 SH 27X BRD (SUTURE) ×2 IMPLANT
SUT VICRYL 0 TIES 12 18 (SUTURE) ×4 IMPLANT
SYR 20CC LL (SYRINGE) ×4 IMPLANT
SYR 50ML LL SCALE MARK (SYRINGE) ×4 IMPLANT
SYR CONTROL 10ML LL (SYRINGE) ×4 IMPLANT
TOWEL OR 17X26 10 PK STRL BLUE (TOWEL DISPOSABLE) ×8 IMPLANT
TOWEL OR NON WOVEN STRL DISP B (DISPOSABLE) ×4 IMPLANT
TRAY FOLEY W/METER SILVER 14FR (SET/KITS/TRAYS/PACK) IMPLANT
TROCAR ADV FIXATION 5X100MM (TROCAR) ×4 IMPLANT
TROCAR BLADELESS 15MM (ENDOMECHANICALS) IMPLANT
TROCAR BLADELESS OPT 5 100 (ENDOMECHANICALS) ×4 IMPLANT
TUBING CONNECTING 10 (TUBING) ×3 IMPLANT
TUBING CONNECTING 10' (TUBING) ×1
TUBING ENDO SMARTCAP (MISCELLANEOUS) ×4 IMPLANT
TUBING FILTER THERMOFLATOR (ELECTROSURGICAL) ×4 IMPLANT
WATER STERILE IRR 1500ML POUR (IV SOLUTION) ×4 IMPLANT

## 2014-11-02 NOTE — H&P (Signed)
Leslie Cohen. Hussar 10/28/2014 10:56 AM Location: [facility] Patient #: [id number] DOB: [birth year] Married / Language: English / Race: White Female  History of Present Illness Leslie Hiss M. Bailie Christenbury MD; 10/28/2014 1:11 PM) Patient words: bari preop.  The patient is a 39 year old female who presents for a pre-op visit. She is currently scheduled for laparoscopic sleeve gastrectomy next week. I initially met her back in the fall in her last visit was on January 28. She had initially been referred to me for an incisional hernia. However after discussing this with her she decided to proceed weight loss surgery. She denies any changes since she was last seen. She states that she is having some tenderness around her hernia site however she denies any fever, chills, nausea, vomiting, diarrhea or constipation. The hernia site does get a little bit tender when she is near her menses. She denies any trips to the emergency room or hospital. She denies any medications.  Her preoperative chest x-ray and upper GI within normal limits. She was H. pylori negative. She was found to have a hemoglobin A1c of 6.7. Her CT scan demonstrated a fascial defect of around 4 cm x 4 cm containing loops of nonobstructed bowel.   Problem List/Past Medical Leslie Hiss Leslie Derby, MD; 10/28/2014 1:11 PM) CHRONIC ASTHMA, UNSPECIFIED ASTHMA SEVERITY, UNCOMPLICATED (301.60  F09.323) GASTROESOPHAGEAL REFLUX DISEASE, ESOPHAGITIS PRESENCE NOT SPECIFIED (530.81  K21.9) INCISIONAL HERNIA, WITHOUT OBSTRUCTION OR GANGRENE (553.21  K43.2) OBESITY, MORBID, BMI 40.0-49.9 (278.01  E66.01)  Other Problems Leslie Curry, MD; 10/28/2014 1:11 PM) Anxiety Disorder Cholelithiasis Kidney Stone Umbilical Hernia Repair Depression  Past Surgical History Leslie Curry, MD; 10/28/2014 1:11 PM) Cesarean Section - Multiple Gallbladder Surgery - Laparoscopic  Diagnostic Studies History Leslie Curry, MD; 10/28/2014 1:11  PM) Pap Smear 1-5 years ago Mammogram never Colonoscopy never  Allergies (Leslie Eversole, LPN; 5/57/3220 25:42 AM) No Known Drug Allergies12/16/2015  Medication History Leslie Curry, MD; 10/28/2014 1:11 PM) ZyrTEC Allergy (10MG Capsule, Oral daily) Active. RA [employer] (Oral daily) Active. Citalopram Hydrobromide (20MG Tablet, Oral as needed) Active. Medications Reconciled OxyCODONE HCl (5MG/5ML Solution, 5-10 Milliliter Oral every four hours, as needed, Taken starting 10/28/2014) Active.  Social History Leslie Curry, MD; 10/28/2014 1:11 PM) Alcohol use Occasional alcohol use. Caffeine use Carbonated beverages. No drug use Tobacco use Never smoker.  Family History Leslie Curry, MD; 10/28/2014 1:11 PM) Arthritis Father. Respiratory Condition Father, Mother. Depression Father. Heart disease in female family member before age 58 Hypertension Father. Alcohol Abuse Father.  Pregnancy / Birth History Leslie Curry, MD; 10/28/2014 1:11 PM) Age at menarche 28 years. Irregular periods Gravida 3 Para 3 Maternal age 62-20 Contraceptive History Oral contraceptives.  Review of Systems Leslie Curry, MD; 10/28/2014 1:06 00) General Not Present- Appetite Loss, Chills, Fatigue, Fever, Night Sweats, Weight Gain and Weight Loss. Skin Not Present- Change in Wart/Mole, Dryness, Hives, Jaundice, New Lesions, Non-Healing Wounds, Rash and Ulcer. HEENT Present- Seasonal Allergies and Wears glasses/contact lenses. Not Present- Earache, Hearing Loss, Hoarseness, Nose Bleed, Oral Ulcers, Ringing in the Ears, Sinus Pain, Sore Throat, Visual Disturbances and Yellow Eyes. Respiratory Not Present- Bloody sputum, Chronic Cough, Difficulty Breathing, Snoring and Wheezing. Breast Not Present- Breast Mass, Breast Pain, Nipple Discharge and Skin Changes. Cardiovascular Present- Leg Cramps. Not Present- Chest Pain, Difficulty Breathing Lying Down, Palpitations, Rapid  Heart Rate, Shortness of Breath and Swelling of Extremities. Gastrointestinal Not Present- Abdominal Pain, Bloating, Bloody Stool, Change in Bowel Habits, Chronic diarrhea,  Constipation, Difficulty Swallowing, Excessive gas, Gets full quickly at meals, Hemorrhoids, Indigestion, Nausea, Rectal Pain and Vomiting. Female Genitourinary Not Present- Frequency, Nocturia, Painful Urination, Pelvic Pain and Urgency. Musculoskeletal Not Present- Back Pain, Joint Pain, Joint Stiffness, Muscle Pain, Muscle Weakness and Swelling of Extremities. Neurological Not Present- Decreased Memory, Fainting, Headaches, Numbness, Seizures, Tingling, Tremor, Trouble walking and Weakness. Psychiatric Present- Anxiety and Depression. Not Present- Bipolar, Change in Sleep Pattern, Fearful and Frequent crying. Endocrine Not Present- Cold Intolerance, Excessive Hunger, Hair Changes, Heat Intolerance, Hot flashes and New Diabetes. Hematology Not Present- Easy Bruising, Excessive bleeding, Gland problems, HIV and Persistent Infections.   Vitals (Leslie Eversole LPN; 0/97/3532 99:24 AM) 10/28/2014 10:56 AM Weight: 242 lb Height: 61in Body Surface Area: 2.17 m Body Mass Index: 45.73 kg/m Temp.: 98.23F(Oral)  Pulse: 84 (Regular)  BP: 126/82 (Sitting, Left Arm, Standard)    Physical Exam Leslie Hiss M. Leslie Watterson MD; 10/28/2014 1:06 PM) General Mental Status-Alert. General Appearance-Consistent with stated age. Hydration-Well hydrated. Voice-Normal. Note: Morbidly obese   Head and Neck Head-normocephalic, atraumatic with no lesions or palpable masses. Trachea-midline. Thyroid Gland Characteristics - normal size and consistency.  Eye Eyeball - Bilateral-Extraocular movements intact. Sclera/Conjunctiva - Bilateral-No scleral icterus.  Chest and Lung Exam Chest and lung exam reveals -quiet, even and easy respiratory effort with no use of accessory muscles and on auscultation, normal breath  sounds, no adventitious sounds and normal vocal resonance. Inspection Chest Wall - Normal. Back - normal.  Breast - Did not examine.  Cardiovascular Cardiovascular examination reveals -normal heart sounds, regular rate and rhythm with no murmurs and normal pedal pulses bilaterally.  Abdomen Inspection  Inspection of the abdomen reveals: Note: She has an obvious bulge at the top of her umbilicus. This bulge extends for about 6 cm superiorly about 5 cm in width. It is a little bit challenging to determine actual dimensions given her body habitus. She was examined supine and standing. It is soft and nontender. The area of question in the left mid abdominal wall that she is concerned that she might have a mass I really don't appreciate anything of note. Skin - Scar - Note: Well-healed trocar scars in right upper quadrant. Small left subcostal incision. Palpation/Percussion Palpation and Percussion of the abdomen reveal - Soft, Non Tender, No Rebound tenderness, No Rigidity (guarding) and No hepatosplenomegaly. Auscultation Auscultation of the abdomen reveals - Bowel sounds normal.  Peripheral Vascular Upper Extremity Palpation - Pulses bilaterally normal.  Neurologic Neurologic evaluation reveals -alert and oriented x 3 with no impairment of recent or remote memory. Mental Status-Normal.  Neuropsychiatric The patient's mood and affect are described as -normal. Judgment and Insight-insight is appropriate concerning matters relevant to self.  Musculoskeletal Normal Exam - Left-Upper Extremity Strength Normal and Lower Extremity Strength Normal. Normal Exam - Right-Upper Extremity Strength Normal and Lower Extremity Strength Normal.  Lymphatic Head & Neck  General Head & Neck Lymphatics: Bilateral - Description - Normal. Axillary - Did not examine. Femoral & Inguinal - Did not examine.    Assessment & Plan Leslie Hiss M. Macalister Arnaud MD; 10/28/2014 1:10 PM) OBESITY,  MORBID, BMI 40.0-49.9 (278.01  E66.01) Impression: She has been approved and is ready for weight loss surgery next week. We did discuss the possibility of having to address her incisional hernia at the time of sleeve gastrectomy if it interferes with her sleeve surgery. We discussed that if I did have to end up repairing her incisional hernia at the time of her gastrectomy next week that I would not  use mesh due to potential risk of contamination. He also discussed the possibility that she would be at higher risk for recurrence given her current weight status. The hope would be for her to lose weight after having sleeve gastrectomy followed by elective incisional hernia repair in the future. We rediscussed the typical postoperative recovery course. All of her questions were asked and answered. She was given her postoperative pain medicine prescription today. We discussed the importance of the preoperative diet Current Plans  Instructions: Congratulations on starting your journey to a healthier life! Over the next few weeks you will be undergoing tests (x-rays and labs) and seeing specialists to help evaluate you for weight loss surgery. These tests and consultations with a psychologist and nutritionist are needed to prepare you for the lifestyle changes that lie ahead and are often required by insurance companies to approve you for surgery. Please call me if you have any questions during the evaluation.  Pathway to Surgery:  Two weeks prior to surgery Go on the extremely low carb liquid diet - this will decrease the size of your liver which will make surgery safer - the nutritionist will go over this at a later date Attend preoperative appointment with your surgeon Attend preoperative surgery class  One week prior to surgery No aspirin products. Tylenol is acceptable  24 hours prior to surgery No alcoholic beverages Report fever greater than 100.5 or excessive nasal drainage suggesting  infection Continue bariatric preop diet Perform bowel prep if ordered Do not eat or drink anything after midnight the night before surgery Do not take any medications except those instructed by the anesthesiologist  Morning of surgery Please arrive at the hospital at least 2 hours before your scheduled surgery time. No makeup, fingernail polish or jewelry Bring insurance cards with you Bring your CPAP mask if you use this Pt Education - PREOPbariatric pathway: discussed with patient and provided information. Started OxyCODONE HCl 5MG/5ML, 5-10 Milliliter every four hours, as needed, 200 Milliliter, 10/28/2014, No Refill. INCISIONAL HERNIA, WITHOUT OBSTRUCTION OR GANGRENE (553.21  K43.2) Impression: She does have an incarcerated ventral incisional hernia. There is no evidence of bowel obstruction however it is not able to be reduced. The size of the fascial defect measures 4.5 cm x 4 cm. There is small bowel protruding through the fascial defect. We did review the signs and symptoms of incarceration and strangulation. She was advised to contact the office should she started having abdominal pain. Again I believe with her current body weight and the fact that this is a moderate size incisional hernia a pure laparoscopic approach would not be in her best interest because of the risk of failure as well as the increased risk of recurrence given her morbid obesity. Moreover, an open ventral hernia repair would necessitate raising subcutaneous skin flaps with possible component separation in order to bring her fascia back together would be at significant risk for wound complications because of her morbid obesity as well as for hernia recurrence at her current body weight. GASTROESOPHAGEAL REFLUX DISEASE, ESOPHAGITIS PRESENCE NOT SPECIFIED (530.81  K21.9) CHRONIC ASTHMA, UNSPECIFIED ASTHMA SEVERITY, UNCOMPLICATED (480.16  P53.748) PREDIABETES  Leighton Ruff. Redmond Pulling, MD, FACS General, Bariatric, & Minimally  Invasive Surgery [facility], [place]

## 2014-11-02 NOTE — Interval H&P Note (Signed)
History and Physical Interval Note:  11/02/2014 12:51 PM  Leslie Cohen  has presented today for surgery, with the diagnosis of Morbid Obesity  The various methods of treatment have been discussed with the patient and family. After consideration of risks, benefits and other options for treatment, the patient has consented to  Procedure(s): LAPAROSCOPIC GASTRIC SLEEVE RESECTION,UPPER ENDOSCOPY (N/A) HERNIA REPAIR INCISIONAL (N/A) as a surgical intervention .  The patient's history has been reviewed, patient examined, no change in status, stable for surgery.  I have reviewed the patient's chart and labs.  Questions were answered to the patient's satisfaction.    [name] Andrey CampanileWilson, MD, FACS General, Bariatric, & Minimally Invasive Surgery [facility] [place]    [facility],Aleksa Catterton M

## 2014-11-02 NOTE — Anesthesia Procedure Notes (Signed)
Procedure Name: Intubation Date/Time: 11/02/2014 1:52 PM Performed by: Leroy LibmanEARDON, Leslie Cohen Patient Re-evaluated:Patient Re-evaluated prior to inductionOxygen Delivery Method: Circle system utilized Preoxygenation: Pre-oxygenation with 100% oxygen Intubation Type: IV induction Ventilation: Mask ventilation without difficulty and Oral airway inserted - appropriate to patient size Laryngoscope Size: Hyacinth MeekerMiller and 2 Grade View: Grade I Tube type: Oral Tube size: 7.5 mm Number of attempts: 1 Airway Equipment and Method: Stylet Placement Confirmation: ETT inserted through vocal cords under direct vision,  breath sounds checked- equal and bilateral and positive ETCO2 Secured at: 21 cm Tube secured with: Tape Dental Injury: Teeth and Oropharynx as per pre-operative assessment

## 2014-11-02 NOTE — Anesthesia Postprocedure Evaluation (Signed)
  Anesthesia Post-op Note  Patient: Leslie Cohen  Procedure(s) Performed: Procedure(s): LAPAROSCOPIC GASTRIC SLEEVE RESECTION,UPPER ENDOSCOPY with hiatal hernia repair (N/A)  Patient Location: PACU  Anesthesia Type:General  Level of Consciousness: awake, oriented, sedated and patient cooperative  Airway and Oxygen Therapy: Patient Spontanous Breathing  Post-op Pain: moderate  Post-op Assessment: Post-op Vital signs reviewed, Patient's Cardiovascular Status Stable, Respiratory Function Stable, Patent Airway, No signs of Nausea or vomiting and Pain level controlled  Post-op Vital Signs: stable  Last Vitals:  Filed Vitals:   11/02/14 1045  BP: 138/77  Temp: 36.9 C  Resp: 16    Complications: No apparent anesthesia complications

## 2014-11-02 NOTE — Transfer of Care (Signed)
Immediate Anesthesia Transfer of Care Note  Patient: Leslie Cohen  Procedure(s) Performed: Procedure(s): LAPAROSCOPIC GASTRIC SLEEVE RESECTION,UPPER ENDOSCOPY with hiatal hernia repair (N/A)  Patient Location: PACU  Anesthesia Type:General  Level of Consciousness:  sedated, patient cooperative and responds to stimulation  Airway & Oxygen Therapy:Patient Spontanous Breathing and Patient connected to face mask oxgen  Post-op Assessment:  Report given to PACU RN and Post -op Vital signs reviewed and stable  Post vital signs:  Reviewed and stable  Last Vitals:  Filed Vitals:   11/02/14 1045  BP: 138/77  Temp: 36.9 C  Resp: 16    Complications: No apparent anesthesia complications

## 2014-11-02 NOTE — Op Note (Signed)
11/02/2014 Leslie Cohen Rosalyn Charters February 10, 1976 [id number]   PRE-OPERATIVE DIAGNOSIS:   Morbid obesity BMI  Ventral incisional hernia GERD Chronic asthma Prediabetes  POST-OPERATIVE DIAGNOSIS:  Same + hiatal hernia  PROCEDURE:  Procedure(s): LAPAROSCOPIC SLEEVE GASTRECTOMY WITH HIATAL HERNIA REPAIR UPPER GI ENDOSCOPY  SURGEON:  Surgeon(s): Atilano Ina, MD FACS FASMBS  ASSISTANTS: Luretha Murphy MD FACS  ANESTHESIA:   general  DRAINS: none   BOUGIE: 36 fr ViSiGi  LOCAL MEDICATIONS USED:  70cc Exparel  SPECIMEN:  Source of Specimen:  Greater curvature of stomach  DISPOSITION OF SPECIMEN:  PATHOLOGY  COUNTS:  YES  INDICATION FOR PROCEDURE: This is a very pleasant 39 year old morbidly obese WF who has had unsuccessful attempts for sustained weight loss. She presents today for a planned laparoscopic sleeve gastrectomy with upper endoscopy and possible incisional hernia repair. We have discussed the risk and benefits of the procedure extensively preoperatively. Please see my separate notes.  PROCEDURE: After obtaining informed consent and receiving 5000 units of subcutaneous heparin, the patient was brought to the operating room at [facility] and placed supine on the operating room table. General endotracheal anesthesia was established. Sequential compression devices were placed. A Foley catheter was placed. A orogastric tube was placed. The patient's abdomen was prepped and draped in the usual standard surgical fashion. She received preoperative IV antibiotics. A surgical timeout was performed.  Access to the abdomen was achieved using a 5 mm 0 laparoscope thru a 5 mm trocar In the left upper Quadrant 2 fingerbreadths below the left subcostal margin using the Optiview technique. Pneumoperitoneum was smoothly established up to 15 mm of mercury. The laparoscope was advanced and the abdominal cavity was surveilled. I identified her known incisional hernia peri-supra umbilically from  her prior cholecyestectomy. There was a plug of omentum in it.    A 5 mm trocar was placed slightly above and to the left of the umbilicus and lateral to her incisional hernia under direct visualization. The patient was then placed in reverse Trendelenburg. The [facility] liver retractor was placed under the left lobe of the liver through a 5 mm trocar incision site in the subxiphoid position. A 5 mm trocar was placed in the lateral right upper quadrant along with a 15 mm trocar in the mid right abdomen  All under direct visualization after local had been infiltrated.  The stomach was inspected. It was completely decompressed and the orogastric tube was removed.  The calibration tube was placed in the oropharynx and guided down into the stomach by the CRNA. 10 mL of air was insufflated into the calibration balloon. The calibration tubing was then gently pulled back by the CRNA and it slid past the GE junction. At this point the calibration tubing was desufflated and pulled back into the esophagus. This confirmed my suspicion of a clinically significant hiatal hernia. The gastrohepatic ligament was incised with harmonic scalpel. The right crus was identified. We identified the crossing fat along the right crus. The adipose tissue just above this area was incised with harmonic scalpel. I then bluntly dissected out this area and identified the left crus. There was evidence of a hiatal hernia. I then mobilized the esophagus. The left and right crus were further mobilized with blunt dissection. I was then able to reapproximate the left and right crus with 0 Ethibond using an Endostitch suture device and securing it with a titanium tyknot.  We then had the CRNA readvanced the calibration tubing back into the stomach. 10 mL of air  was insufflated into the calibration tube balloon. The calibration tube was then gently pulled back and there was resistance at the GE junction. The tube did not slide back up into the  esophagus. At this point the calibration tubing was deflated and removed from the patient's body.  We identified the pylorus and measured 5 cm proximal to the pylorus and identified an area of where we would start taking down the short gastric vessels. Harmonic scalpel was used to take down the short gastric vessels along the greater curvature of the stomach. We were able to enter the lesser sac. We continued to march along the greater curvature of the stomach taking down the short gastrics. As we approached the gastrosplenic ligament we took care in this area not to injure the spleen. We were able to take down the entire gastrosplenic ligament. We then mobilized the fundus away from the left crus of diaphragm. There were not any significant posterior gastric avascular attachments. This left the stomach completely mobilized. No vessels had been taken down along the lesser curvature of the stomach.  We then reidentified the pylorus. A 36Fr ViSiGi was then placed in the oropharynx and advanced down into the stomach and placed in the distal antrum and positioned along the lesser curvature. It was placed under suction which secured the 36Fr ViSiGi in place along the lesser curve. Then using the Ethicon echelon 60 mm stapler with a green load with Seamguard, I placed a stapler along the antrum approximately 5 cm from the pylorus. The stapler was angled so that there is ample room at the angularis incisura. I then fired the first staple load after inspecting it posteriorly to ensure adequate space both anteriorly and posteriorly. At this point I still was not completely past the angularis so with another green load with Seamguard, I placed the stapler in position just inside the prior stapleline. We then rotated the stomach to insure that there was adequate anteriorly as well as posteriorly. The stapler was then fired. I used another 60mm green cartridge with seamguard. At this point I started using 60 mm blue load  staple cartridges with Seamguard. The echelon stapler was then repositioned with a 60 mm blue load with Seamguard and we continued to march up along the ViSiGi. My assistant was holding traction along the greater curvature stomach along the cauterized short gastric vessels ensuring that the stomach was symmetrically retracted. Prior to each firing of the staple, we rotated the stomach to ensure that there is adequate stomach left.  As we approached the fundus, I used 60 mm blue cartridge with Seamguard aiming slightly lateral to the esophageal fat pad. Although the staples on this fire had completely gone thru the last part of the stomach it had not completely cut it. Therefore 1 additional 60 blue load was used to free the remaining stomach. The sleeve was inspected. There is no evidence of cork screw. The staple line appeared hemostatic. The CRNA inflated the ViSiGi to the green zone and the upper abdomen was flooded with saline. There were no bubbles. The sleeve was decompressed and the ViSiGi removed. My assistant scrubbed out and performed an upper endoscopy. The sleeve easily distended with air and the scope was easily advanced to the pylorus. There is no evidence of internal bleeding or cork screwing. There was no narrowing at the angularis. There is no evidence of bubbles. Please see his operative note for further details. The gastric sleeve was decompressed and the endoscope was removed.  The greater curvature the stomach was grasped with a laparoscopic grasper and removed from the 15 mm trocar site.  The liver retractor was removed. I then closed the 15 mm trocar site with 2 interrupted 0 Vicryl sutures through the fascia using the endoclose. The closure was viewed laparoscopically and it was airtight. 70 cc of Exparel was then infiltrated in the preperitoneal spaces around the trocar sites. Pneumoperitoneum was released. All trocar sites were closed with a 4-0 Monocryl in a subcuticular fashion followed  by the application of liquidband exceed. The patient was extubated and taken to the recovery room in stable condition. All needle, instrument, and sponge counts were correct x2. There are no immediate complications  (3) 60 mm green with Seamguard (2) 60 mm blue with 1 seamguard  PLAN OF CARE: Admit to inpatient   PATIENT DISPOSITION:  PACU - hemodynamically stable.   Delay start of Pharmacological VTE agent (>24hrs) due to surgical blood loss or risk of bleeding:  no  Mary Sella. Andrey Campanile, MD, FACS General, Bariatric, & Minimally Invasive Surgery [facility], [place]

## 2014-11-02 NOTE — Anesthesia Preprocedure Evaluation (Signed)
Anesthesia Evaluation  Patient identified by MRN, date of birth, ID band Patient awake    Reviewed: Allergy & Precautions, NPO status , Patient's Chart, lab work & pertinent test results  Airway Mallampati: II  TM Distance: <3 FB     Dental   Pulmonary asthma ,    Pulmonary exam normal       Cardiovascular Rhythm:Regular Rate:Normal     Neuro/Psych Depression    GI/Hepatic   Endo/Other  Morbid obesity  Renal/GU Renal disease     Musculoskeletal   Abdominal   Peds  Hematology   Anesthesia Other Findings IBS  Reproductive/Obstetrics                             Anesthesia Physical Anesthesia Plan  ASA: II  Anesthesia Plan: General   Post-op Pain Management:    Induction: Intravenous  Airway Management Planned: Oral ETT  Additional Equipment:   Intra-op Plan:   Post-operative Plan: Extubation in OR  Informed Consent: I have reviewed the patients History and Physical, chart, labs and discussed the procedure including the risks, benefits and alternatives for the proposed anesthesia with the patient or authorized representative who has indicated his/her understanding and acceptance.     Plan Discussed with: CRNA, Anesthesiologist and Surgeon  Anesthesia Plan Comments:         Anesthesia Quick Evaluation

## 2014-11-03 ENCOUNTER — Encounter (HOSPITAL_COMMUNITY): Payer: Self-pay | Admitting: General Surgery

## 2014-11-03 ENCOUNTER — Inpatient Hospital Stay (HOSPITAL_COMMUNITY): Payer: 59

## 2014-11-03 LAB — COMPREHENSIVE METABOLIC PANEL
ALBUMIN: 3.8 g/dL (ref 3.5–5.0)
ALT: 66 U/L — ABNORMAL HIGH (ref 14–54)
ANION GAP: 4 — AB (ref 5–15)
AST: 66 U/L — ABNORMAL HIGH (ref 15–41)
Alkaline Phosphatase: 74 U/L (ref 38–126)
BUN: 9 mg/dL (ref 6–20)
CHLORIDE: 104 mmol/L (ref 101–111)
CO2: 26 mmol/L (ref 22–32)
Calcium: 8.2 mg/dL — ABNORMAL LOW (ref 8.9–10.3)
Creatinine, Ser: 0.64 mg/dL (ref 0.44–1.00)
GFR calc Af Amer: >60 mL/min (ref >60)
GFR calc non Af Amer: >60 mL/min (ref >60)
GLUCOSE: 155 mg/dL — AB (ref 70–99)
POTASSIUM: 4.6 mmol/L (ref 3.5–5.1)
Sodium: 134 mmol/L — ABNORMAL LOW (ref 135–145)
TOTAL PROTEIN: 7.2 g/dL (ref 6.5–8.1)
Total Bilirubin: 0.7 mg/dL (ref 0.3–1.2)

## 2014-11-03 LAB — CBC WITH DIFFERENTIAL/PLATELET
Basophils Absolute: 0 10*3/uL (ref 0.0–0.1)
Basophils Relative: 0 % (ref 0–1)
EOS ABS: 0 10*3/uL (ref 0.0–0.7)
EOS PCT: 0 % (ref 0–5)
HCT: 36.6 % (ref 36.0–46.0)
Hemoglobin: 11.8 g/dL — ABNORMAL LOW (ref 12.0–15.0)
LYMPHS ABS: 1.4 10*3/uL (ref 0.7–4.0)
LYMPHS PCT: 13 % (ref 12–46)
MCH: 27.3 pg (ref 26.0–34.0)
MCHC: 32.2 g/dL (ref 30.0–36.0)
MCV: 84.7 fL (ref 78.0–100.0)
MONO ABS: 0.4 10*3/uL (ref 0.1–1.0)
MONOS PCT: 4 % (ref 3–12)
NEUTROS ABS: 8.8 10*3/uL — AB (ref 1.7–7.7)
NEUTROS PCT: 83 % — AB (ref 43–77)
Platelets: 386 10*3/uL (ref 150–400)
RBC: 4.32 MIL/uL (ref 3.87–5.11)
RDW: 12.9 % (ref 11.5–15.5)
WBC: 10.7 10*3/uL — AB (ref 4.0–10.5)

## 2014-11-03 LAB — GLUCOSE, CAPILLARY
GLUCOSE-CAPILLARY: 122 mg/dL — AB (ref 70–99)
GLUCOSE-CAPILLARY: 128 mg/dL — AB (ref 70–99)
Glucose-Capillary: 123 mg/dL — ABNORMAL HIGH (ref 70–99)

## 2014-11-03 LAB — HEMOGLOBIN AND HEMATOCRIT, BLOOD
HCT: 35.3 % — ABNORMAL LOW (ref 36.0–46.0)
HEMOGLOBIN: 11.3 g/dL — AB (ref 12.0–15.0)

## 2014-11-03 MED ORDER — ALUM & MAG HYDROXIDE-SIMETH 200-200-20 MG/5ML PO SUSP: 15.0000 mL | Freq: Four times a day (QID) | ORAL | Status: DC | PRN

## 2014-11-03 MED ORDER — CITALOPRAM HYDROBROMIDE 20 MG PO TABS
20.0000 mg | ORAL_TABLET | Freq: Every day | ORAL | Status: DC
  Administered 2014-11-04: 20 mg via ORAL
  Filled 2014-11-03 (×3): qty 1

## 2014-11-03 MED ORDER — PROMETHAZINE HCL 25 MG/ML IJ SOLN
12.5000 mg | Freq: Four times a day (QID) | INTRAMUSCULAR | Status: DC | PRN
  Administered 2014-11-03: 12.5 mg via INTRAVENOUS
  Filled 2014-11-03: qty 1

## 2014-11-03 MED ORDER — IOHEXOL 300 MG/ML  SOLN
50.0000 mL | Freq: Once | INTRAMUSCULAR | Status: AC | PRN
  Administered 2014-11-03: 20 mL via ORAL

## 2014-11-03 MED ORDER — CETIRIZINE HCL 10 MG PO TABS
10.0000 mg | ORAL_TABLET | Freq: Every day | ORAL | Status: DC
  Administered 2014-11-04: 10 mg via ORAL
  Filled 2014-11-03 (×3): qty 1

## 2014-11-03 NOTE — Progress Notes (Signed)
1 Day Post-Op  Subjective: Slept ok. Some epigastric pressure/discomfort. Ambulated a total of 6 times since surgery, pain ok  Objective: Vital signs in last 24 hours: Temp:  [97.6 F (36.4 C)-98.9 F (37.2 C)] 97.6 F (36.4 C) (05/03 1425) Pulse Rate:  [50-98] 53 (05/03 1425) Resp:  [16-22] 16 (05/03 1425) BP: (114-150)/(41-78) 141/78 mmHg (05/03 1425) SpO2:  [94 %-100 %] 100 % (05/03 1425) Last BM Date: 11/01/14  Intake/Output from previous day: 05/02 0701 - 05/03 0700 In: 1839.2 [I.V.:1779.2; IV Piggyback:60] Out: 1775 [Urine:1725; Blood:50] Intake/Output this shift: Total I/O In: 1375 [I.V.:1375] Out: 1150 [Urine:1150]  Alert, nad, nontoxic cta b/l Reg Soft, obese, incisions c/d/i; mild expected TTP No edema; +SCDs  Lab Results:   Recent Labs  11/02/14 1630 11/03/14 0430  WBC  --  10.7*  HGB 12.6 11.8*  HCT 37.9 36.6  PLT  --  386   BMET  Recent Labs  11/03/14 0430  NA 134*  K 4.6  CL 104  CO2 26  GLUCOSE 155*  BUN 9  CREATININE 0.64  CALCIUM 8.2*   PT/INR No results for input(s): LABPROT, INR in the last 72 hours. ABG No results for input(s): PHART, HCO3 in the last 72 hours.  Invalid input(s): PCO2, PO2  Studies/Results: Dg Ugi W/water Sol Cm  11/03/2014   CLINICAL DATA:  [age] year old female status post sleeve gastrectomy and hernia repair yesterday.  EXAM: WATER SOLUBLE UPPER GI SERIES  TECHNIQUE: Single-column upper GI series was performed using water soluble contrast.  CONTRAST:  20mL OMNIPAQUE IOHEXOL 300 MG/ML  SOLN  COMPARISON:  09/07/2014.  FLUOROSCOPY TIME:  If the device does not provide the exposure index:  Fluoroscopy Time (in minutes and seconds):  1 minutes and 4 seconds.  Number of Acquired Images:  16  FINDINGS: Preprocedure KUB demonstrates several nondilated gas-filled loops in the central abdomen. Some gas is also noted throughout the colon. Surgical clips in the right upper quadrant related to prior cholecystectomy. 6 mm  calcification projecting over the interpolar region of the right kidney, compatible with a nonobstructive calculus.  Subsequently, following ingestion of Omnipaque, the stomach was opacified, demonstrating a narrowed gastric lumen typical of sleeve gastrectomy. No extravasation of contrast material was noted at any point during the examination. Contrast readily traversed the pylorus into the proximal small bowel.  IMPRESSION: 1. Expected postoperative appearance following sleeve gastrectomy, as above, without acute complicating features.   Electronically Signed   By: Trudie Reedaniel  Entrikin M.D.   On: 11/03/2014 12:07    Anti-infectives: Anti-infectives    Start     Dose/Rate Route Frequency Ordered Stop   11/02/14 1043  cefoTEtan in Dextrose 5% (CEFOTAN) IVPB 2 g     2 g Intravenous On call to O.R. 11/02/14 1043 11/02/14 1408      Assessment/Plan: s/p Procedure(s): LAPAROSCOPIC GASTRIC SLEEVE RESECTION,UPPER ENDOSCOPY with hiatal hernia repair (N/A)  Looks good For UGI this am, if ok start POD 1 diet pulm toilet, is Cont vte prophylaxis  [name] Andrey CampanileWilson, MD, FACS General, Bariatric, & Minimally Invasive Surgery [facility], [place]   LOS: 1 day    Leslie InaWILSON,[name] 11/03/2014

## 2014-11-03 NOTE — Progress Notes (Signed)
Patient alert and oriented, Post op day 1.  Provided support and encouragement.  Encouraged pulmonary toilet, ambulation and small sips of liquids when swallow study returned satisfactorily.  All questions answered.  Will continue to monitor. 

## 2014-11-04 LAB — CBC WITH DIFFERENTIAL/PLATELET
BASOS PCT: 0 % (ref 0–1)
Basophils Absolute: 0 10*3/uL (ref 0.0–0.1)
EOS PCT: 1 % (ref 0–5)
Eosinophils Absolute: 0.1 10*3/uL (ref 0.0–0.7)
HEMATOCRIT: 33.6 % — AB (ref 36.0–46.0)
Hemoglobin: 10.8 g/dL — ABNORMAL LOW (ref 12.0–15.0)
Lymphocytes Relative: 34 % (ref 12–46)
Lymphs Abs: 3.3 10*3/uL (ref 0.7–4.0)
MCH: 27.8 pg (ref 26.0–34.0)
MCHC: 32.1 g/dL (ref 30.0–36.0)
MCV: 86.4 fL (ref 78.0–100.0)
MONO ABS: 0.9 10*3/uL (ref 0.1–1.0)
Monocytes Relative: 9 % (ref 3–12)
Neutro Abs: 5.6 10*3/uL (ref 1.7–7.7)
Neutrophils Relative %: 56 % (ref 43–77)
Platelets: 382 10*3/uL (ref 150–400)
RBC: 3.89 MIL/uL (ref 3.87–5.11)
RDW: 13.4 % (ref 11.5–15.5)
WBC: 10 10*3/uL (ref 4.0–10.5)

## 2014-11-04 NOTE — Consult Note (Signed)
   [facility]   11/04/2014  Mosetta AnisChristine M Eshleman 09/28/1975 [id number]   Came to visit patient as a benefit of [employer]/Link to Wellness program for [employer] employees/dependents with [employer] insurance. Explained program to patient. No Link to Wellness needs at this time. Left brochure at bedside.   Raiford NobleAtika Hall, MSN-Ed, RN,BSN [facility] Liaison [phone]

## 2014-11-04 NOTE — Plan of Care (Signed)
Problem: Food- and Nutrition-Related Knowledge Deficit (NB-1.1) Goal: Nutrition education Formal process to instruct or train a patient/client in a skill or to impart knowledge to help patients/clients voluntarily manage or modify food choices and eating behavior to maintain or improve health. Outcome: Completed/Met Date Met:  11/04/14 Nutrition Education Note  Received consult for diet education per DROP protocol.   Discussed 2 week post op diet with pt. Emphasized that liquids must be non carbonated, non caffeinated, and sugar free. Fluid goals discussed. Pt to follow up with outpatient bariatric RD for further diet progression after 2 weeks. Multivitamins and minerals also reviewed. Teach back method used, pt expressed understanding, expect good compliance.   Diet: First 2 Weeks  You will see the nutritionist about two (2) weeks after your surgery. The nutritionist will increase the types of foods you can eat if you are handling liquids well:  If you have severe vomiting or nausea and cannot handle clear liquids lasting longer than 1 day, call your surgeon  Protein Shake  Drink at least 2 ounces of shake 5-6 times per day  Each serving of protein shakes (usually 8 - 12 ounces) should have a minimum of:  15 grams of protein  And no more than 5 grams of carbohydrate  Goal for protein each day:  Men = 80 grams per day  Women = 60 grams per day  Protein powder may be added to fluids such as non-fat milk or Lactaid milk or Soy milk (limit to 35 grams added protein powder per serving)   Hydration  Slowly increase the amount of water and other clear liquids as tolerated (See Acceptable Fluids)  Slowly increase the amount of protein shake as tolerated  Sip fluids slowly and throughout the day  May use sugar substitutes in small amounts (no more than 6 - 8 packets per day; i.e. Splenda)   Fluid Goal  The first goal is to drink at least 8 ounces of protein shake/drink per day (or as directed  by the nutritionist); some examples of protein shakes are [employer], [employer], EAS Edge HP, and Unjury. See handout from pre-op Bariatric Education Class:  Slowly increase the amount of protein shake you drink as tolerated  You may find it easier to slowly sip shakes throughout the day  It is important to get your proteins in first  Your fluid goal is to drink 64 - 100 ounces of fluid daily  It may take a few weeks to build up to this  32 oz (or more) should be clear liquids  And  32 oz (or more) should be full liquids (see below for examples)  Liquids should not contain sugar, caffeine, or carbonation   Clear Liquids:  Water or Sugar-free flavored water (i.e. Fruit H2O, Propel)  Decaffeinated coffee or tea (sugar-free)  Crystal Lite, Wyler's Lite, Minute Maid Lite  Sugar-free Jell-O  Bouillon or broth  Sugar-free Popsicle: *Less than 20 calories each; Limit 1 per day   Full Liquids:  Protein Shakes/Drinks + 2 choices per day of other full liquids  Full liquids must be:  No More Than 12 grams of Carbs per serving  No More Than 3 grams of Fat per serving  Strained low-fat cream soup  Non-Fat milk  Fat-free Lactaid Milk  Sugar-free yogurt (Dannon Lite & Fit, Kellogg yogurt)    [place], [place], [place]

## 2014-11-04 NOTE — Progress Notes (Signed)
2 Days Post-Op  Subjective: Had some "CO2 gas pain yesterday in neck". Also had some nausea with pain medication. Ambulated mulitiple times. Water went ok.   Objective: Vital signs in last 24 hours: Temp:  [97.6 F (36.4 C)-98.8 F (37.1 C)] 98.7 F (37.1 C) (05/04 0635) Pulse Rate:  [53-68] 68 (05/04 0635) Resp:  [16] 16 (05/04 0635) BP: (101-141)/(54-78) 112/55 mmHg (05/04 0635) SpO2:  [97 %-100 %] 97 % (05/04 0635) Weight:  [48.081 kg (106 lb)-106.8 kg (235 lb 7.2 oz)] 106.8 kg (235 lb 7.2 oz) (05/04 0635) Last BM Date: 11/01/14  Intake/Output from previous day: 05/03 0701 - 05/04 0700 In: 4145 [P.O.:90; I.V.:3875; IV Piggyback:180] Out: 2950 [Urine:2950] Intake/Output this shift:    Alert, nontoxic, smiling cta b/l Reg Soft, incisions c/d/i, nontender except mild soreness at extraction site No edema  Lab Results:   Recent Labs  11/03/14 0430 11/03/14 1554 11/04/14 0437  WBC 10.7*  --  10.0  HGB 11.8* 11.3* 10.8*  HCT 36.6 35.3* 33.6*  PLT 386  --  382   BMET  Recent Labs  11/03/14 0430  NA 134*  K 4.6  CL 104  CO2 26  GLUCOSE 155*  BUN 9  CREATININE 0.64  CALCIUM 8.2*   PT/INR No results for input(s): LABPROT, INR in the last 72 hours. ABG No results for input(s): PHART, HCO3 in the last 72 hours.  Invalid input(s): PCO2, PO2  Studies/Results: Dg Ugi W/water Sol Cm  11/03/2014   CLINICAL DATA:  39 year old female status post sleeve gastrectomy and hernia repair yesterday.  EXAM: WATER SOLUBLE UPPER GI SERIES  TECHNIQUE: Single-column upper GI series was performed using water soluble contrast.  CONTRAST:  20mL OMNIPAQUE IOHEXOL 300 MG/ML  SOLN  COMPARISON:  09/07/2014.  FLUOROSCOPY TIME:  If the device does not provide the exposure index:  Fluoroscopy Time (in minutes and seconds):  1 minutes and 4 seconds.  Number of Acquired Images:  16  FINDINGS: Preprocedure KUB demonstrates several nondilated gas-filled loops in the central abdomen. Some gas is  also noted throughout the colon. Surgical clips in the right upper quadrant related to prior cholecystectomy. 6 mm calcification projecting over the interpolar region of the right kidney, compatible with a nonobstructive calculus.  Subsequently, following ingestion of Omnipaque, the stomach was opacified, demonstrating a narrowed gastric lumen typical of sleeve gastrectomy. No extravasation of contrast material was noted at any point during the examination. Contrast readily traversed the pylorus into the proximal small bowel.  IMPRESSION: 1. Expected postoperative appearance following sleeve gastrectomy, as above, without acute complicating features.   Electronically Signed   By: Trudie Reedaniel  Entrikin M.D.   On: 11/03/2014 12:07    Anti-infectives: Anti-infectives    Start     Dose/Rate Route Frequency Ordered Stop   11/02/14 1043  cefoTEtan in Dextrose 5% (CEFOTAN) IVPB 2 g     2 g Intravenous On call to O.R. 11/02/14 1043 11/02/14 1408      Assessment/Plan: s/p Procedure(s): LAPAROSCOPIC GASTRIC SLEEVE RESECTION,UPPER ENDOSCOPY with hiatal hernia repair (N/A) No fever, tachycardia. Looks good.  Adv to POD 2 diet Will d/w nurse & coordinator later this am to gauge PO status and make final call about dc  Discussed dc instructions  Mary Sellaric M. Andrey CampanileWilson, MD, FACS General, Bariatric, & Minimally Invasive Surgery [facility], [place]   LOS: 2 days    [name],Jinny Sweetland M 11/04/2014

## 2014-11-04 NOTE — Progress Notes (Signed)
Patient alert and oriented, Post op day 2.  Provided support and encouragement.  Encouraged pulmonary toilet, ambulation and small sips of liquids.  Patient advanced to protein shake today, not tolerating due to smell, making her nauseated.  Husband is bringing in home shakes to see if they are better tolerated.  All questions answered.  Will continue to monitor.

## 2014-11-05 MED ORDER — PANTOPRAZOLE SODIUM 40 MG PO TBEC: 40.0000 mg | DELAYED_RELEASE_TABLET | Freq: Every day | ORAL | Status: DC

## 2014-11-05 MED ORDER — OXYCODONE HCL 5 MG/5ML PO SOLN: 5.0000 mg | ORAL | Status: DC | PRN

## 2014-11-05 MED ORDER — ONDANSETRON 4 MG PO TBDP: 4.0000 mg | ORAL_TABLET | Freq: Three times a day (TID) | ORAL | Status: DC | PRN

## 2014-11-05 NOTE — Discharge Instructions (Signed)

## 2014-11-05 NOTE — Progress Notes (Signed)
Patient alert and oriented, pain is controlled. Patient is tolerating fluids,advance to protein shake yesterday, patient tolerating today.  Reviewed Gastric sleeve discharge instructions with patient and patient is able to articulate understanding.  Provided information on BELT program, Support Group and [facility] outpatient pharmacy. All questions answered, will continue to monitor.

## 2014-11-05 NOTE — Discharge Summary (Signed)
Physician Discharge Summary  Leslie Cohen WUJ:[id number] DOB: 09/09/1975 DOA: 11/02/2014  PCP: Ignatius SpeckingVYAS,DHRUV B., MD  Admit date: 11/02/2014 Discharge date: 11/05/2014  Recommendations for Outpatient Follow-up:    Follow-up Information    Follow up with [name],Kriston Mckinnie M, MD. Go on 11/19/2014.   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 Please see operative note. Preoperatively the patient was given 5000 units of subcutaneous heparin for DVT prophylaxis. Postoperative prophylactic Lovenox dosing was started on the morning of postoperative day 1. The patient underwent an upper GI on postoperative day 1 which demonstrated no extravasation of contrast and emptying of the contrast into the small bowel. The patient was started on ice chips and water which they tolerated. On postoperative day 2 The patient's diet was advanced to protein shakes which she had some nausea with. . The patient was ambulating without difficulty. Her family brought her some protein shakes from home which she tolerated. On pod 3, she was tolerating protein shakes and her nausea was much  improved. Their vital signs are stable without fever or tachycardia. Their hemoglobin had remained stable.  The patient had received discharge instructions and counseling. They were deemed stable for discharge.  BP 118/71 mmHg  Pulse 61  Temp(Src) 98.2 F (36.8 C) (Oral)  Resp 18  Ht 5\' 1"  (1.549 m)  Wt 106.9 kg (235 lb 10.8 oz)  BMI 44.55 kg/m2  SpO2 97%  LMP 10/06/2014  Gen: alert, NAD, non-toxic appearing Pupils: equal, no scleral icterus Pulm: Lungs clear to auscultation, symmetric chest rise CV: regular rate and rhythm Abd: soft, nontender, nondistended. Some bruising around trocar sites. No cellulitis. Soft reducible ventral incisional hernia Ext: no edema, no calf tenderness Skin: no rash, no jaundice   Discharge Instructions  Discharge Instructions    Ambulate hourly while awake    Complete by:  As directed      Call MD for:  difficulty breathing, headache or visual disturbances    Complete by:  As directed      Call MD for:  persistant dizziness or light-headedness    Complete by:  As directed      Call MD for:  persistant nausea and vomiting    Complete by:  As directed      Call MD for:  redness, tenderness, or signs of infection (pain, swelling, redness, odor or green/yellow discharge around incision site)    Complete by:  As directed      Call MD for:  severe uncontrolled pain    Complete by:  As directed      Call MD for:  temperature >101 F    Complete by:  As  directed      Diet bariatric full liquid    Complete by:  As directed      Discharge instructions    Complete by:  As directed   See bariatric discharge instructions     Incentive spirometry    Complete by:  As directed   Perform hourly while awake            Medication List    TAKE these medications        cetirizine 10 MG tablet  Commonly known as:  ZYRTEC  Take 10 mg by mouth daily.     citalopram 20 MG tablet  Commonly known as:  CELEXA  Take 20 mg by mouth at bedtime.      multivitamin with minerals Tabs tablet  Take 1 tablet by mouth daily.     ondansetron 4 MG disintegrating tablet  Commonly known as:  ZOFRAN ODT  Take 1 tablet (4 mg total) by mouth every 8 (eight) hours as needed for nausea or vomiting.     oxyCODONE 5 MG/5ML solution  Commonly known as:  ROXICODONE  Take 5-10 mLs (5-10 mg total) by mouth every 4 (four) hours as needed for moderate pain or severe pain.     pantoprazole 40 MG tablet  Commonly known as:  PROTONIX  Take 1 tablet (40 mg total) by mouth daily.           Follow-up Information    Follow up with Atilano Ina, MD. Go on 11/19/2014.   Specialty:  General Surgery   Why:  For Post-Op Check @ 10:15AM   Contact information:   [address] [place] [address] [phone]        The results of significant diagnostics from this hospitalization (including imaging, microbiology, ancillary and laboratory) are listed below for reference.    Significant Diagnostic Studies: Dg Ugi W/water Sol Cm  11/03/2014   CLINICAL DATA:  39 year old female status post sleeve gastrectomy and hernia repair yesterday.  EXAM: WATER SOLUBLE UPPER GI SERIES  TECHNIQUE: Single-column upper GI series was performed using water soluble contrast.  CONTRAST:  20mL OMNIPAQUE IOHEXOL 300 MG/ML  SOLN  COMPARISON:  09/07/2014.  FLUOROSCOPY TIME:  If the device does not provide the exposure index:  Fluoroscopy Time (in minutes and seconds):  1 minutes and 4 seconds.  Number of Acquired Images:  16  FINDINGS: Preprocedure KUB demonstrates several nondilated gas-filled loops in the central abdomen. Some gas is also noted throughout the colon. Surgical clips in the right upper quadrant related to prior cholecystectomy. 6 mm calcification projecting over the interpolar region of the right kidney, compatible with a nonobstructive calculus.  Subsequently, following ingestion of Omnipaque, the stomach was opacified, demonstrating a narrowed gastric lumen  typical of sleeve gastrectomy. No extravasation of contrast material was noted at any point during the examination. Contrast readily traversed the pylorus into the proximal small bowel.  IMPRESSION: 1. Expected postoperative appearance following sleeve gastrectomy, as above, without acute complicating features.   Electronically Signed   By: Trudie Reed M.D.   On: 11/03/2014 12:07    Labs: Basic Metabolic Panel:  Recent Labs Lab 11/03/14 0430  NA 134*  K 4.6  CL 104  CO2 26  GLUCOSE 155*  BUN 9  CREATININE 0.64  CALCIUM 8.2*   Liver Function Tests:  Recent Labs Lab 11/03/14 0430  AST 66*  ALT 66*  ALKPHOS 74  BILITOT 0.7  PROT 7.2  ALBUMIN 3.8  CBC:  Recent Labs Lab 11/02/14 1630 11/03/14 0430 11/03/14 1554 11/04/14 0437  WBC  --  10.7*  --  10.0  NEUTROABS  --  8.8*  --  5.6  HGB 12.6 11.8* 11.3* 10.8*  HCT 37.9 36.6 35.3* 33.6*  MCV  --  84.7  --  86.4  PLT  --  386  --  382    CBG:  Recent Labs Lab 11/03/14 0811 11/03/14 1207 11/03/14 1618  GLUCAP 123* 122* 128*    Active Problems:   Obesity, Class III, BMI 40-49.9 (morbid obesity)   Incisional hernia, without obstruction or gangrene   Prediabetes   GERD (gastroesophageal reflux disease)   Asthma, chronic   S/P laparoscopic sleeve gastrectomy   Time coordinating discharge: 15 minutes  Signed:  Atilano InaEric M Vassie Kugel, MD [facility], [place] [phone] 11/05/2014, 8:01 AM

## 2014-11-09 ENCOUNTER — Telehealth (HOSPITAL_COMMUNITY): Payer: Self-pay

## 2014-11-09 NOTE — Telephone Encounter (Signed)
Made discharge phone call to patient per DROP protocol. Asking the following questions.    1. Do you have someone to care for you now that you are home?  yes 2. Are you having pain now that is not relieved by your pain medication?   3. Are you able to drink the recommended daily amount of fluids (48 ounces minimum/day) and protein (60-80 grams/day) as prescribed by the dietitian or nutritional counselor? Protein, yes, fluids not quite yet ...but I am working on it 4. Are you taking the vitamins and minerals as prescribed?  yes 5. Do you have the "on call" number to contact your surgeon if you have a problem or question?  yes 6. Are your incisions free of redness, swelling or drainage? (If steri strips, address that these can fall off, shower as tolerated) incisions red and puffy, no drainage, possible reaction to the glue 7. Have your bowels moved since your surgery?  If not, are you passing gas?  yes 8. Are you up and walking 3-4 times per day?  yes    1. Do you have an appointment made to see your surgeon in the next month?  yes 2. Were you provided your discharge medications before your surgery or before you were discharged from the hospital and are you taking them without problem?  yes 3. Were you provided phone numbers to the clinic/surgeon's office?  yes 4. Did you watch the patient education video module in the (clinic, surgeon's office, etc.) before your surgery? yes 5. Do you have a discharge checklist that was provided to you in the hospital to reference with instructions on how to take care of yourself after surgery?  yes 6. Did you see a dietitian or nutritional counselor while you were in the hospital?  yes 7. Do you have an appointment to see a dietitian or nutritional counselor in the next month?  yes

## 2014-11-17 ENCOUNTER — Encounter: Payer: 59 | Attending: General Surgery

## 2014-11-17 VITALS — Ht 61.0 in | Wt 227.5 lb

## 2014-11-17 DIAGNOSIS — Z6841 Body Mass Index (BMI) 40.0 and over, adult: Secondary | ICD-10-CM | POA: Diagnosis not present

## 2014-11-17 DIAGNOSIS — Z713 Dietary counseling and surveillance: Secondary | ICD-10-CM | POA: Insufficient documentation

## 2014-11-17 NOTE — Progress Notes (Signed)
Bariatric Class:  Appt start time: 1530 end time:  1630.  2 Week Post-Operative Nutrition Class  Patient was seen on 11/17/14 for Post-Operative Nutrition education at [facility].   Surgery date: 11/02/14 Surgery type: Gastric sleeve Start weight at [facility]: 243.5 lbs on 08/24/14 Weight today: 227.5 lbs  Weight change: 14 lbs  TANITA  BODY COMP RESULTS  10/19/14 11/17/14   BMI (kg/m^2) 45.6 43.0   Fat Mass (lbs) 121.5 108.5   Fat Free Mass (lbs) 120 119.0   Total Body Water (lbs) 88 87.0    The following the learning objectives were met by the patient during this course:  Identifies Phase 3A (Soft, High Proteins) Dietary Goals and will begin from 2 weeks post-operatively to 2 months post-operatively  Identifies appropriate sources of fluids and proteins   States protein recommendations and appropriate sources post-operatively  Identifies the need for appropriate texture modifications, mastication, and bite sizes when consuming solids  Identifies appropriate multivitamin and calcium sources post-operatively  Describes the need for physical activity post-operatively and will follow MD recommendations  States when to call healthcare provider regarding medication questions or post-operative complications  Handouts given during class include:  Phase 3A: Soft, High Protein Diet Handout  Follow-Up Plan: Patient will follow-up at [facility] in 6 weeks for 2 month post-op nutrition visit for diet advancement per MD.

## 2014-12-28 ENCOUNTER — Encounter: Payer: 59 | Attending: General Surgery | Admitting: Dietician

## 2014-12-28 ENCOUNTER — Encounter: Payer: Self-pay | Admitting: Dietician

## 2014-12-28 VITALS — Ht 61.0 in | Wt 205.0 lb

## 2014-12-28 DIAGNOSIS — Z6841 Body Mass Index (BMI) 40.0 and over, adult: Secondary | ICD-10-CM | POA: Diagnosis not present

## 2014-12-28 DIAGNOSIS — Z713 Dietary counseling and surveillance: Secondary | ICD-10-CM | POA: Insufficient documentation

## 2014-12-28 NOTE — Progress Notes (Signed)
  Follow-up visit:  8 Weeks Post-Operative Sleeve Gastrectomy Surgery  Medical Nutrition Therapy:  Appt start time: 0910 end time:  0940.  Primary concerns today: Post-operative Bariatric Surgery Nutrition Management. Returns with a 22.5 lbs. Tolerating foods, though "doesn't want to eat". Sometimes can eat more than eat other times.   Had some blood in her stool recently and thinks she may have a hemmorhoid.   Works 3rd shift.  Surgery date: 11/02/14 Surgery type: Gastric sleeve Start weight at [facility]: 243.5 lbs on 08/24/14 Weight today: 205.0 lbs  Weight change: 22.5 lbs Total weight loss: 38.5 lbs  TANITA  BODY COMP RESULTS  10/19/14 11/17/14 12/28/14   BMI (kg/m^2) 45.6 43.0 38.7   Fat Mass (lbs) 121.5 108.5 97.0   Fat Free Mass (lbs) 120 119.0 108.0   Total Body Water (lbs) 88 87.0 79.0     Preferred Learning Style:   No preference indicated   Learning Readiness:   Ready  24-hr recall: B (8 AM): 2 eggs sometimes sometimes with 1 oz meat (12-19 g) Snk (AM): sleeping L (4 PM): 1-2 oz meat with 1/4 cup refried beans with cheese or green bean (7-18 g) Snk (10-11PM): yogurt greek whipped (9-11 g) Snk (1 AM): 3 oz tuna with spoonful of ranch (21 g) Snk (3-4 AM): cheese or chicken salad (6 g)  Fluid intake: water with crystal light or mio (~48 oz) Estimated total protein intake: 55-75 g  Medications: see list Supplementation: taking, using calcium petites   Using straws: Yes - last cup has a straw Drinking while eating: No Hair loss: No Carbonated beverages: had some sips when nauseas N/V/D/C: had some nausea with some sauce she didn't like and some salmon and spaghetti squash, constipation regularly and has had diarrhea 3 x  Dumping syndrome: No  Recent physical activity: walks 5000-9000 steps at work, swims in pool for 15 minutes 3 x week, yard work   Progress Towards Goal(s):  In progress.  Handouts given during visit include:  Phase 3B High Protein and Non  starchy vegetables   Nutritional Diagnosis:  -3.3 Overweight/obesity related to past poor dietary habits and physical inactivity as evidenced by patient w/ recent sleeve gastrectomy surgery following dietary guidelines for continued weight loss.    Intervention:  Nutrition education/diet advancment. Goals:  Follow Phase 3B: High Protein + Non-Starchy Vegetables  Eat 3-6 small meals/snacks, every 3-5 hrs  Increase lean protein foods to meet 60g goal  Increase fluid intake to 64oz +  Avoid drinking 15 minutes before, during and 30 minutes after eating  Aim for >30 min of physical activity daily  Try 2% cheese sticks  Teaching Method Utilized:  Visual Auditory Hands on  Barriers to learning/adherence to lifestyle change: none  Demonstrated degree of understanding via:  Teach Back   Monitoring/Evaluation:  Dietary intake, exercise, and body weight. Follow up in 1 months for 3 month post-op visit.

## 2014-12-28 NOTE — Patient Instructions (Addendum)
Goals:  Follow Phase 3B: High Protein + Non-Starchy Vegetables  Eat 3-6 small meals/snacks, every 3-5 hrs  Increase lean protein foods to meet 60g goal  Increase fluid intake to 64oz +  Avoid drinking 15 minutes before, during and 30 minutes after eating  Aim for >30 min of physical activity daily  Try 2% cheese sticks  Surgery date: 11/02/14 Surgery type: Gastric sleeve Start weight at [facility]: 243.5 lbs on 08/24/14 Weight today: 205.0 lbs  Weight change: 22.5 lbs Total weight loss: 38.5 lbs  TANITA  BODY COMP RESULTS  10/19/14 11/17/14 12/28/14   BMI (kg/m^2) 45.6 43.0 38.7   Fat Mass (lbs) 121.5 108.5 97.0   Fat Free Mass (lbs) 120 119.0 108.0   Total Body Water (lbs) 88 87.0 79.0

## 2015-02-02 ENCOUNTER — Encounter: Payer: 59 | Attending: General Surgery | Admitting: Dietician

## 2015-02-02 ENCOUNTER — Encounter: Payer: Self-pay | Admitting: Dietician

## 2015-02-02 VITALS — Ht 61.0 in | Wt 196.0 lb

## 2015-02-02 DIAGNOSIS — E66813 Obesity, class 3: Secondary | ICD-10-CM

## 2015-02-02 DIAGNOSIS — Z713 Dietary counseling and surveillance: Secondary | ICD-10-CM | POA: Insufficient documentation

## 2015-02-02 DIAGNOSIS — Z6841 Body Mass Index (BMI) 40.0 and over, adult: Secondary | ICD-10-CM | POA: Diagnosis not present

## 2015-02-02 NOTE — Progress Notes (Signed)
  Follow-up visit: 12 Weeks Post-Operative Sleeve Gastrectomy Surgery  Medical Nutrition Therapy:  Appt start time: 830 end time:  910   Primary concerns today: Post-operative Bariatric Surgery Nutrition Management. Returns with a 18.5 lbs fat mass loss. Has started exercising more. Trouble getting in all vitamins. Has a lot of energy. Tolerating foods, though "doesn't want to eat" still. Has trouble drinking when she is at home.   Works 3rd shift.  Surgery date: 11/02/14 Surgery type: Gastric sleeve Start weight at [facility]: 243.5 lbs on 08/24/14 Weight today: 196.0 lbs  Weight change: 9 lbs, 18 lbs of fat mass loss Total weight loss: 47.5 lbs  TANITA  BODY COMP RESULTS  10/19/14 11/17/14 12/28/14 02/02/15   BMI (kg/m^2) 45.6 43.0 38.7 37.0   Fat Mass (lbs) 121.5 108.5 97.0 79.0   Fat Free Mass (lbs) 120 119.0 108.0 117.0   Total Body Water (lbs) 88 87.0 79.0 85.5     Preferred Learning Style:   No preference indicated   Learning Readiness:   Ready  24-hr recall: B (8 AM): 2 eggs with cheese sometimes with 1 oz meat (18-25g) Snk (AM): sleeping L (4-6 PM): 1-2 oz meat with 1/4 cup refried beans with cheese or vegetables/salad (7-18 g) Snk (10-11PM): yogurt greek whipped (9-11 g) Snk (1 AM): 2-3 oz tuna/fish with spoonful of ranch (14-21 g) Snk (3-4 AM): cheese or chicken/tuna salad or yogurt and cheese (6-12 g) And one Premier Protein every other day if she has eaten enough (30 g)  Fluid intake: 48 water with crystal light or mio (~48 oz) and 11 oz protein shake sometiems Estimated total protein intake: 55-117 g  Medications: see list Supplementation: has trouble taking in all doses  Using straws: No Drinking while eating: No Hair loss: Nothing differnt Carbonated beverages: having 30 cc 3x week when she wants to burp (Coke) N/V/D/C: had some nausea with too much food Dumping syndrome: No  Recent physical activity: walks 5000-9000 steps at work, 4 x week walks 30 minutes at  work or ellipitical for 20 minutes swims in pool sometimes   Progress Towards Goal(s):  In progress.  Handouts given during visit include:  Phase 3B High Protein and Non starchy vegetables   Nutritional Diagnosis:  [place]-3.3 Overweight/obesity related to past poor dietary habits and physical inactivity as evidenced by patient w/ recent sleeve gastrectomy surgery following dietary guidelines for continued weight loss.    Intervention:  Nutrition education/diet advancment. Goals:  Follow Phase 3B: High Protein + Non-Starchy Vegetables  Eat 3-6 small meals/snacks, every 3-5 hrs  Increase lean protein foods to meet 60g goal  Increase fluid intake to 64oz + especially at home  Avoid drinking 15 minutes before, during and 30 minutes after eating  Aim for >30 min of physical activity daily  Remember to take second multivitamin  Eat protein first, then vegetable, and then if there is room sometimes have bread or fruit  Teaching Method Utilized:  Visual Auditory Hands on  Barriers to learning/adherence to lifestyle change: none  Demonstrated degree of understanding via:  Teach Back   Monitoring/Evaluation:  Dietary intake, exercise, and body weight. Follow up in 3 months for 6 month post-op visit.

## 2015-02-02 NOTE — Patient Instructions (Addendum)
Goals:  Follow Phase 3B: High Protein + Non-Starchy Vegetables  Eat 3-6 small meals/snacks, every 3-5 hrs  Increase lean protein foods to meet 60g goal  Increase fluid intake to 64oz + especially at home  Avoid drinking 15 minutes before, during and 30 minutes after eating  Aim for >30 min of physical activity daily  Remember to take second multivitamin  Eat protein first, then vegetable, and then if there is room sometimes have bread or fruit  Surgery date: 11/02/14 Surgery type: Gastric sleeve Start weight at [facility]: 243.5 lbs on 08/24/14 Weight today: 205.0 lbs  Weight change: 22.5 lbs Total weight loss: 38.5 lbs  Surgery date: 11/02/14 Surgery type: Gastric sleeve Start weight at [facility]: 243.5 lbs on 08/24/14 Weight today: 196.0 lbs  Weight change: 9 lbs, 18 lbs of fat mass loss Total weight loss: 47.5 lbs  TANITA  BODY COMP RESULTS  10/19/14 11/17/14 12/28/14 02/02/15   BMI (kg/m^2) 45.6 43.0 38.7 37.0   Fat Mass (lbs) 121.5 108.5 97.0 79.0   Fat Free Mass (lbs) 120 119.0 108.0 117.0   Total Body Water (lbs) 88 87.0 79.0 85.5

## 2015-05-04 ENCOUNTER — Encounter: Payer: Self-pay | Admitting: Dietician

## 2015-05-04 ENCOUNTER — Encounter: Payer: 59 | Attending: General Surgery | Admitting: Dietician

## 2015-05-04 VITALS — Ht 61.0 in | Wt 166.0 lb

## 2015-05-04 DIAGNOSIS — Z713 Dietary counseling and surveillance: Secondary | ICD-10-CM | POA: Diagnosis not present

## 2015-05-04 DIAGNOSIS — Z6841 Body Mass Index (BMI) 40.0 and over, adult: Secondary | ICD-10-CM | POA: Diagnosis not present

## 2015-05-04 DIAGNOSIS — E66813 Obesity, class 3: Secondary | ICD-10-CM

## 2015-05-04 NOTE — Progress Notes (Signed)
  Follow-up visit: 6 Months Post-Operative Sleeve Gastrectomy Surgery  Medical Nutrition Therapy:  Appt start time: 825 end time:  855  Primary concerns today: Post-operative Bariatric Surgery Nutrition Management. Returns with a 30 weight loss. Having problems with her hernia recently and plans to have surgery at the end of December. Started doing kickboxing. Walking faster overall. Doing well overall. Has tried a few carbs but it's very rarely.   Better about getting in fluid at home.   Works 3rd shift.  Surgery date: 11/02/14 Surgery type: Gastric sleeve [facility]: 243.5 lbs on 08/24/14 Weight today: 166.0 lbs  Weight change: 30 lbs, 18 lbs of fat mass loss Total weight loss: 77.5 lbs  TANITA  BODY COMP RESULTS  10/19/14 11/17/14 12/28/14 02/02/15 05/04/15   BMI (kg/m^2) 45.6 43.0 38.7 37.0 31.4   Fat Mass (lbs) 121.5 108.5 97.0 79.0 61.5   Fat Free Mass (lbs) 120 119.0 108.0 117.0 104.5   Total Body Water (lbs) 88 87.0 79.0 85.5 76.5    Preferred Learning Style:   No preference indicated   Learning Readiness:   Ready  24-hr recall: B (8 AM): 2 eggs with cheese sometimes with 1 oz meat (18-25g) Snk (AM): sleeping L (4-6 PM): 1-2 oz meat with 1/4 cup refried beans with cheese or vegetables/salad (7-18 g) Snk (10-11PM): yogurt greek whipped or cheese (9-11 g) Snk (1 AM): 2-3 oz tuna/fish with spoonful of ranch (14-21 g) Snk (3-4 AM): cheese or chicken/tuna salad or yogurt and cheese (6-12 g) And one Premier Protein every other day if she has eaten enough (30 g)  Fluid intake: 64 water with crystal light or mio and 11 oz protein shake sometiems Estimated total protein intake: 55-117 g  Medications: see list Supplementation: taking (much better than before)  Using straws: No Drinking while eating: No Hair loss: yes, though getting better Carbonated beverages: very little, sips to make her burb N/V/D/C: vomited when hernia strangulated this weekend Dumping  syndrome: No  Recent physical activity: walks 5000-9000 steps at work, kick boxing 60 minutes 2 x week, 2  x week walks 15- 30 minutes at work  Progress Towards Goal(s):  In progress.  Handouts given during visit include:  none   Nutritional Diagnosis:  [place]-3.3 Overweight/obesity related to past poor dietary habits and physical inactivity as evidenced by patient w/ recent sleeve gastrectomy surgery following dietary guidelines for continued weight loss.    Intervention:  Nutrition education/diet advancment. Goals:  Follow Phase 3B: High Protein + Non-Starchy Vegetables  Eat 3-6 small meals/snacks, every 3-5 hrs  Increase lean protein foods to meet 60g goal  Increase fluid intake to 64oz +   Avoid drinking 15 minutes before, during and 30 minutes after eating  Aim for >30 min of physical activity daily  Eat protein first, then vegetable, and then if there is room sometimes have bread or fruit  Teaching Method Utilized:  Visual Auditory Hands on  Barriers to learning/adherence to lifestyle change: none  Demonstrated degree of understanding via:  Teach Back   Monitoring/Evaluation:  Dietary intake, exercise, and body weight. Follow up in 6 months for 12 month post-op visit.

## 2015-05-04 NOTE — Patient Instructions (Signed)
Goals:  Follow Phase 3B: High Protein + Non-Starchy Vegetables  Eat 3-6 small meals/snacks, every 3-5 hrs  Increase lean protein foods to meet 60g goal  Increase fluid intake to 64oz +   Avoid drinking 15 minutes before, during and 30 minutes after eating  Aim for >30 min of physical activity daily  Eat protein first, then vegetable, and then if there is room sometimes have bread or fruit  Surgery date: 11/02/14 Surgery type: Gastric sleeve [facility]: 243.5 lbs on 08/24/14 Weight today: 166.0 lbs  Weight change: 30 lbs, 18 lbs of fat mass loss Total weight loss: 77.5 lbs  TANITA  BODY COMP RESULTS  10/19/14 11/17/14 12/28/14 02/02/15 05/04/15   BMI (kg/m^2) 45.6 43.0 38.7 37.0 31.4   Fat Mass (lbs) 121.5 108.5 97.0 79.0 61.5   Fat Free Mass (lbs) 120 119.0 108.0 117.0 104.5   Total Body Water (lbs) 88 87.0 79.0 85.5 76.5

## 2015-06-17 NOTE — Patient Instructions (Addendum)
YOUR PROCEDURE IS SCHEDULED ON :  06/30/15  REPORT TO [facility] MAIN ENTRANCE FOLLOW SIGNS TO EAST ELEVATOR - GO TO 3rd FLOOR CHECK IN AT 3 EAST NURSES STATION (SHORT STAY) AT:  5:15 AM  CALL THIS NUMBER IF YOU HAVE PROBLEMS THE MORNING OF SURGERY [phone]  REMEMBER:ONLY 1 PER PERSON MAY GO TO SHORT STAY WITH YOU TO GET READY THE MORNING OF YOUR SURGERY  DO NOT EAT FOOD OR DRINK LIQUIDS AFTER MIDNIGHT  TAKE THESE MEDICINES THE MORNING OF SURGERY: NONE  STOP ASPIRIN / IBUPROFEN / ALEVE / VITAMINS / HERBAL MEDS __5__ DAYS BEFORE SURGERY  YOU MAY NOT HAVE ANY METAL ON YOUR BODY INCLUDING HAIR PINS AND PIERCING'S. DO NOT WEAR JEWELRY, MAKEUP, LOTIONS, POWDERS OR PERFUMES. DO NOT WEAR NAIL POLISH. DO NOT SHAVE 48 HRS PRIOR TO SURGERY. MEN MAY SHAVE FACE AND NECK.  DO NOT BRING VALUABLES TO HOSPITAL. [facility] IS NOT RESPONSIBLE FOR VALUABLES.  CONTACTS, DENTURES OR PARTIALS MAY NOT BE WORN TO SURGERY. LEAVE SUITCASE IN CAR. CAN BE BROUGHT TO ROOM AFTER SURGERY.  PATIENTS DISCHARGED THE DAY OF SURGERY WILL NOT BE ALLOWED TO DRIVE HOME.  PLEASE READ OVER THE FOLLOWING INSTRUCTION SHEETS _________________________________________________________________________________                                           - PREPARING FOR SURGERY  Before surgery, you can play an important role.  Because skin is not sterile, your skin needs to be as free of germs as possible.  You can reduce the number of germs on your skin by washing with CHG (chlorahexidine gluconate) soap before surgery.  CHG is an antiseptic cleaner which kills germs and bonds with the skin to continue killing germs even after washing. Please DO NOT use if you have an allergy to CHG or antibacterial soaps.  If your skin becomes reddened/irritated stop using the CHG and inform your nurse when you arrive at Short Stay. Do not shave (including legs and underarms) for at least 48 hours prior to the  first CHG shower.  You may shave your face. Please follow these instructions carefully:   1.  Shower with CHG Soap the night before surgery and the  morning of Surgery.   2.  If you choose to wash your hair, wash your hair first as usual with your  normal  Shampoo.   3.  After you shampoo, rinse your hair and body thoroughly to remove the  shampoo.                                         4.  Use CHG as you would any other liquid soap.  You can apply chg directly  to the skin and wash . Gently wash with scrungie or clean wascloth    5.  Apply the CHG Soap to your body ONLY FROM THE NECK DOWN.   Do not use on open                           Wound or open sores. Avoid contact with eyes, ears mouth and genitals (private parts).  Genitals (private parts) with your normal soap.              6.  Wash thoroughly, paying special attention to the area where your surgery  will be performed.   7.  Thoroughly rinse your body with warm water from the neck down.   8.  DO NOT shower/wash with your normal soap after using and rinsing off  the CHG Soap .                9.  Pat yourself dry with a clean towel.             10.  Wear clean night clothes to bed after shower             11.  Place clean sheets on your bed the night of your first shower and do not  sleep with pets.  Day of Surgery : Do not apply any lotions/deodorants the morning of surgery.  Please wear clean clothes to the hospital/surgery center.  FAILURE TO FOLLOW THESE INSTRUCTIONS MAY RESULT IN THE CANCELLATION OF YOUR SURGERY    PATIENT SIGNATURE_________________________________  ______________________________________________________________________

## 2015-06-18 ENCOUNTER — Ambulatory Visit: Payer: Self-pay | Admitting: General Surgery

## 2015-06-23 ENCOUNTER — Encounter (HOSPITAL_COMMUNITY)
Admission: RE | Admit: 2015-06-23 | Discharge: 2015-06-23 | Disposition: A | Discharge status: Home / Self Care | Payer: 59 | Source: Ambulatory Visit | Attending: General Surgery | Admitting: General Surgery

## 2015-06-23 ENCOUNTER — Encounter (HOSPITAL_COMMUNITY): Payer: Self-pay

## 2015-06-23 DIAGNOSIS — Z01812 Encounter for preprocedural laboratory examination: Secondary | ICD-10-CM | POA: Diagnosis not present

## 2015-06-23 DIAGNOSIS — K432 Incisional hernia without obstruction or gangrene: Secondary | ICD-10-CM | POA: Insufficient documentation

## 2015-06-23 HISTORY — DX: Ventral hernia without obstruction or gangrene: K43.9

## 2015-06-23 HISTORY — DX: Personal history of urinary calculi: Z87.442

## 2015-06-23 HISTORY — DX: Prediabetes: R73.03

## 2015-06-23 LAB — COMPREHENSIVE METABOLIC PANEL
ALT: 19 U/L (ref 14–54)
ANION GAP: 5 (ref 5–15)
AST: 21 U/L (ref 15–41)
Albumin: 3.9 g/dL (ref 3.5–5.0)
Alkaline Phosphatase: 62 U/L (ref 38–126)
BILIRUBIN TOTAL: 0.6 mg/dL (ref 0.3–1.2)
BUN: 21 mg/dL — ABNORMAL HIGH (ref 6–20)
CALCIUM: 9.3 mg/dL (ref 8.9–10.3)
CO2: 29 mmol/L (ref 22–32)
Chloride: 108 mmol/L (ref 101–111)
Creatinine, Ser: 0.56 mg/dL (ref 0.44–1.00)
GFR calc non Af Amer: >60 mL/min (ref >60)
Glucose, Bld: 97 mg/dL (ref 65–99)
Potassium: 4.1 mmol/L (ref 3.5–5.1)
SODIUM: 142 mmol/L (ref 135–145)
TOTAL PROTEIN: 7 g/dL (ref 6.5–8.1)

## 2015-06-23 LAB — CBC WITH DIFFERENTIAL/PLATELET
BASOS ABS: 0 10*3/uL (ref 0.0–0.1)
Basophils Relative: 0 %
EOS ABS: 0.4 10*3/uL (ref 0.0–0.7)
Eosinophils Relative: 4 %
HEMATOCRIT: 37 % (ref 36.0–46.0)
Hemoglobin: 12.3 g/dL (ref 12.0–15.0)
Lymphocytes Relative: 29 %
Lymphs Abs: 2.7 10*3/uL (ref 0.7–4.0)
MCH: 30.4 pg (ref 26.0–34.0)
MCHC: 33.2 g/dL (ref 30.0–36.0)
MCV: 91.6 fL (ref 78.0–100.0)
MONO ABS: 0.7 10*3/uL (ref 0.1–1.0)
MONOS PCT: 8 %
NEUTROS ABS: 5.4 10*3/uL (ref 1.7–7.7)
Neutrophils Relative %: 59 %
Platelets: 398 10*3/uL (ref 150–400)
RBC: 4.04 MIL/uL (ref 3.87–5.11)
RDW: 13.7 % (ref 11.5–15.5)
WBC: 9.2 10*3/uL (ref 4.0–10.5)

## 2015-06-23 LAB — HCG, SERUM, QUALITATIVE: PREG SERUM: NEGATIVE

## 2015-06-29 ENCOUNTER — Encounter (HOSPITAL_COMMUNITY): Payer: Self-pay | Admitting: Anesthesiology

## 2015-06-29 NOTE — Anesthesia Preprocedure Evaluation (Addendum)
Anesthesia Evaluation  Patient identified by MRN, date of birth, ID band Patient awake    Reviewed: Allergy & Precautions, NPO status , Patient's Chart, lab work & pertinent test results  Airway Mallampati: II  TM Distance: >3 FB Neck ROM: Full    Dental no notable dental hx.    Pulmonary asthma ,    Pulmonary exam normal breath sounds clear to auscultation       Cardiovascular Exercise Tolerance: Good negative cardio ROS Normal cardiovascular exam Rhythm:Regular Rate:Normal     Neuro/Psych PSYCHIATRIC DISORDERS Depression negative neurological ROS     GI/Hepatic Neg liver ROS, GERD  Medicated,  Endo/Other  negative endocrine ROS  Renal/GU Renal diseaseH/O Kidney stones  negative genitourinary   Musculoskeletal negative musculoskeletal ROS (+)   Abdominal (+) + obese,   Peds negative pediatric ROS (+)  Hematology negative hematology ROS (+)   Anesthesia Other Findings   Reproductive/Obstetrics negative OB ROS                            Anesthesia Physical Anesthesia Plan  ASA: II  Anesthesia Plan: General   Post-op Pain Management:    Induction: Intravenous  Airway Management Planned: Oral ETT  Additional Equipment:   Intra-op Plan:   Post-operative Plan: Extubation in OR  Informed Consent: I have reviewed the patients History and Physical, chart, labs and discussed the procedure including the risks, benefits and alternatives for the proposed anesthesia with the patient or authorized representative who has indicated his/her understanding and acceptance.   Dental advisory given  Plan Discussed with: CRNA  Anesthesia Plan Comments:         Anesthesia Quick Evaluation

## 2015-06-30 ENCOUNTER — Encounter (HOSPITAL_COMMUNITY)
Admission: RE | Disposition: A | Discharge status: Home / Self Care | Payer: Self-pay | Source: Ambulatory Visit | Attending: General Surgery

## 2015-06-30 ENCOUNTER — Inpatient Hospital Stay (HOSPITAL_COMMUNITY): Payer: 59 | Admitting: Anesthesiology

## 2015-06-30 ENCOUNTER — Inpatient Hospital Stay (HOSPITAL_COMMUNITY)
Admission: RE | Admit: 2015-06-30 | Discharge: 2015-07-03 | DRG: 355 | Disposition: A | Discharge status: Home / Self Care | Payer: 59 | Source: Ambulatory Visit | Attending: General Surgery | Admitting: General Surgery

## 2015-06-30 ENCOUNTER — Encounter (HOSPITAL_COMMUNITY): Payer: Self-pay | Admitting: *Deleted

## 2015-06-30 DIAGNOSIS — Z01812 Encounter for preprocedural laboratory examination: Secondary | ICD-10-CM

## 2015-06-30 DIAGNOSIS — Z9884 Bariatric surgery status: Secondary | ICD-10-CM

## 2015-06-30 DIAGNOSIS — F419 Anxiety disorder, unspecified: Secondary | ICD-10-CM | POA: Diagnosis present

## 2015-06-30 DIAGNOSIS — Z6830 Body mass index (BMI) 30.0-30.9, adult: Secondary | ICD-10-CM

## 2015-06-30 DIAGNOSIS — K432 Incisional hernia without obstruction or gangrene: Secondary | ICD-10-CM | POA: Diagnosis present

## 2015-06-30 DIAGNOSIS — K219 Gastro-esophageal reflux disease without esophagitis: Secondary | ICD-10-CM | POA: Diagnosis present

## 2015-06-30 DIAGNOSIS — Z8249 Family history of ischemic heart disease and other diseases of the circulatory system: Secondary | ICD-10-CM | POA: Diagnosis not present

## 2015-06-30 DIAGNOSIS — F329 Major depressive disorder, single episode, unspecified: Secondary | ICD-10-CM | POA: Diagnosis present

## 2015-06-30 DIAGNOSIS — E669 Obesity, unspecified: Secondary | ICD-10-CM | POA: Diagnosis present

## 2015-06-30 HISTORY — PX: APPLICATION OF WOUND VAC: SHX5189

## 2015-06-30 HISTORY — PX: VENTRAL HERNIA REPAIR: SHX424

## 2015-06-30 HISTORY — PX: INSERTION OF MESH: SHX5868

## 2015-06-30 HISTORY — PX: PANNICULECTOMY: SHX5360

## 2015-06-30 SURGERY — PANNICULECTOMY
Anesthesia: General | Site: Abdomen

## 2015-06-30 MED ORDER — ONDANSETRON HCL 4 MG/2ML IJ SOLN
INTRAMUSCULAR | Status: DC | PRN
  Administered 2015-06-30: 4 mg via INTRAVENOUS

## 2015-06-30 MED ORDER — DIPHENHYDRAMINE HCL 50 MG/ML IJ SOLN
12.5000 mg | Freq: Four times a day (QID) | INTRAMUSCULAR | Status: DC | PRN
  Administered 2015-07-01: 12.5 mg via INTRAVENOUS
  Filled 2015-06-30 (×2): qty 1

## 2015-06-30 MED ORDER — HEPARIN SODIUM (PORCINE) 5000 UNIT/ML IJ SOLN
5000.0000 [IU] | Freq: Three times a day (TID) | INTRAMUSCULAR | Status: DC
  Administered 2015-07-01 – 2015-07-03 (×7): 5000 [IU] via SUBCUTANEOUS
  Filled 2015-06-30 (×10): qty 1

## 2015-06-30 MED ORDER — DEXAMETHASONE SODIUM PHOSPHATE 10 MG/ML IJ SOLN
INTRAMUSCULAR | Status: AC
  Filled 2015-06-30: qty 1

## 2015-06-30 MED ORDER — ZOLPIDEM TARTRATE 5 MG PO TABS: 5.0000 mg | ORAL_TABLET | Freq: Every evening | ORAL | Status: DC | PRN

## 2015-06-30 MED ORDER — EPHEDRINE SULFATE 50 MG/ML IJ SOLN
INTRAMUSCULAR | Status: AC
  Filled 2015-06-30: qty 1

## 2015-06-30 MED ORDER — PROMETHAZINE HCL 25 MG/ML IJ SOLN
12.5000 mg | Freq: Four times a day (QID) | INTRAMUSCULAR | Status: DC | PRN
  Filled 2015-06-30: qty 1

## 2015-06-30 MED ORDER — PROPOFOL 10 MG/ML IV BOLUS
INTRAVENOUS | Status: DC | PRN
  Administered 2015-06-30: 20 mg via INTRAVENOUS
  Administered 2015-06-30: 150 mg via INTRAVENOUS
  Administered 2015-06-30: 20 mg via INTRAVENOUS

## 2015-06-30 MED ORDER — ONDANSETRON HCL 4 MG/2ML IJ SOLN
INTRAMUSCULAR | Status: AC
  Filled 2015-06-30: qty 2

## 2015-06-30 MED ORDER — FENTANYL CITRATE (PF) 250 MCG/5ML IJ SOLN
INTRAMUSCULAR | Status: AC
  Filled 2015-06-30: qty 5

## 2015-06-30 MED ORDER — ACETAMINOPHEN 500 MG PO TABS
1000.0000 mg | ORAL_TABLET | Freq: Four times a day (QID) | ORAL | Status: DC
  Administered 2015-06-30 – 2015-07-03 (×10): 1000 mg via ORAL
  Filled 2015-06-30 (×18): qty 2

## 2015-06-30 MED ORDER — HYDROMORPHONE HCL 1 MG/ML IJ SOLN
INTRAMUSCULAR | Status: DC | PRN
  Administered 2015-06-30 (×4): 0.5 mg via INTRAVENOUS

## 2015-06-30 MED ORDER — SUCCINYLCHOLINE CHLORIDE 20 MG/ML IJ SOLN
INTRAMUSCULAR | Status: DC | PRN
  Administered 2015-06-30: 100 mg via INTRAVENOUS

## 2015-06-30 MED ORDER — CEFAZOLIN SODIUM-DEXTROSE 2-3 GM-% IV SOLR
2.0000 g | INTRAVENOUS | Status: AC
  Administered 2015-06-30: 2 g via INTRAVENOUS

## 2015-06-30 MED ORDER — SODIUM CHLORIDE 0.9 % IJ SOLN
INTRAMUSCULAR | Status: AC
  Filled 2015-06-30: qty 10

## 2015-06-30 MED ORDER — NEOSTIGMINE METHYLSULFATE 10 MG/10ML IV SOLN
INTRAVENOUS | Status: DC | PRN
  Administered 2015-06-30: 4 mg via INTRAVENOUS

## 2015-06-30 MED ORDER — HYDROMORPHONE HCL 2 MG/ML IJ SOLN
INTRAMUSCULAR | Status: AC
  Filled 2015-06-30: qty 1

## 2015-06-30 MED ORDER — CEFAZOLIN SODIUM-DEXTROSE 2-3 GM-% IV SOLR
INTRAVENOUS | Status: AC
  Filled 2015-06-30: qty 50

## 2015-06-30 MED ORDER — 0.9 % SODIUM CHLORIDE (POUR BTL) OPTIME
TOPICAL | Status: DC | PRN
  Administered 2015-06-30: 1000 mL

## 2015-06-30 MED ORDER — MIDAZOLAM HCL 5 MG/5ML IJ SOLN
INTRAMUSCULAR | Status: DC | PRN
  Administered 2015-06-30: 2 mg via INTRAVENOUS

## 2015-06-30 MED ORDER — LACTATED RINGERS IV SOLN: INTRAVENOUS | Status: DC

## 2015-06-30 MED ORDER — LIDOCAINE HCL (CARDIAC) 20 MG/ML IV SOLN
INTRAVENOUS | Status: AC
  Filled 2015-06-30: qty 5

## 2015-06-30 MED ORDER — SODIUM CHLORIDE 0.9 % IR SOLN
Status: AC
  Filled 2015-06-30: qty 1

## 2015-06-30 MED ORDER — LACTATED RINGERS IV SOLN
INTRAVENOUS | Status: DC | PRN
  Administered 2015-06-30 (×3): via INTRAVENOUS

## 2015-06-30 MED ORDER — FENTANYL CITRATE (PF) 100 MCG/2ML IJ SOLN
INTRAMUSCULAR | Status: DC | PRN
  Administered 2015-06-30 (×7): 50 ug via INTRAVENOUS

## 2015-06-30 MED ORDER — HYDROMORPHONE HCL 1 MG/ML IJ SOLN
INTRAMUSCULAR | Status: AC
  Filled 2015-06-30: qty 1

## 2015-06-30 MED ORDER — PROMETHAZINE HCL 25 MG/ML IJ SOLN
6.2500 mg | INTRAMUSCULAR | Status: DC | PRN
  Administered 2015-06-30: 12.5 mg via INTRAVENOUS

## 2015-06-30 MED ORDER — SODIUM CHLORIDE 0.9 % IR SOLN
Status: DC | PRN
  Administered 2015-06-30: 500 mL

## 2015-06-30 MED ORDER — MORPHINE SULFATE 2 MG/ML IV SOLN
INTRAVENOUS | Status: DC
  Administered 2015-06-30: 7.9 mg via INTRAVENOUS
  Administered 2015-06-30: 11:00:00 via INTRAVENOUS
  Administered 2015-07-01: 9 mg via INTRAVENOUS
  Administered 2015-07-01: 7.5 mg via INTRAVENOUS

## 2015-06-30 MED ORDER — PROPOFOL 10 MG/ML IV BOLUS
INTRAVENOUS | Status: AC
  Filled 2015-06-30: qty 20

## 2015-06-30 MED ORDER — PANTOPRAZOLE SODIUM 40 MG IV SOLR
40.0000 mg | Freq: Every day | INTRAVENOUS | Status: DC
  Administered 2015-07-01 – 2015-07-02 (×3): 40 mg via INTRAVENOUS
  Filled 2015-06-30 (×4): qty 40

## 2015-06-30 MED ORDER — KCL IN DEXTROSE-NACL 20-5-0.45 MEQ/L-%-% IV SOLN
INTRAVENOUS | Status: DC
  Administered 2015-06-30 – 2015-07-01 (×2): 100 mL/h via INTRAVENOUS
  Filled 2015-06-30 (×6): qty 1000

## 2015-06-30 MED ORDER — LIDOCAINE HCL (CARDIAC) 20 MG/ML IV SOLN
INTRAVENOUS | Status: DC | PRN
  Administered 2015-06-30: 40 mg via INTRATRACHEAL
  Administered 2015-06-30: 80 mg via INTRAVENOUS

## 2015-06-30 MED ORDER — ONDANSETRON HCL 4 MG/2ML IJ SOLN: 4.0000 mg | Freq: Four times a day (QID) | INTRAMUSCULAR | Status: DC | PRN

## 2015-06-30 MED ORDER — ATROPINE SULFATE 0.4 MG/ML IJ SOLN
INTRAMUSCULAR | Status: AC
  Filled 2015-06-30: qty 1

## 2015-06-30 MED ORDER — MIDAZOLAM HCL 2 MG/2ML IJ SOLN
INTRAMUSCULAR | Status: AC
  Filled 2015-06-30: qty 2

## 2015-06-30 MED ORDER — DIPHENHYDRAMINE HCL 50 MG/ML IJ SOLN
INTRAMUSCULAR | Status: DC
Start: 2015-06-30 — End: 2015-06-30
  Filled 2015-06-30: qty 1

## 2015-06-30 MED ORDER — CHLORHEXIDINE GLUCONATE 4 % EX LIQD: 1.0000 "application " | Freq: Once | CUTANEOUS | Status: DC

## 2015-06-30 MED ORDER — EPHEDRINE SULFATE 50 MG/ML IJ SOLN
INTRAMUSCULAR | Status: DC | PRN
  Administered 2015-06-30: 5 mg via INTRAVENOUS

## 2015-06-30 MED ORDER — SODIUM CHLORIDE 0.9 % IJ SOLN: 9.0000 mL | INTRAMUSCULAR | Status: DC | PRN

## 2015-06-30 MED ORDER — PROMETHAZINE HCL 25 MG/ML IJ SOLN
INTRAMUSCULAR | Status: AC
  Filled 2015-06-30: qty 1

## 2015-06-30 MED ORDER — DIPHENHYDRAMINE HCL 12.5 MG/5ML PO ELIX
12.5000 mg | ORAL_SOLUTION | Freq: Four times a day (QID) | ORAL | Status: DC | PRN
  Filled 2015-06-30: qty 5

## 2015-06-30 MED ORDER — NEOSTIGMINE METHYLSULFATE 10 MG/10ML IV SOLN
INTRAVENOUS | Status: AC
  Filled 2015-06-30: qty 1

## 2015-06-30 MED ORDER — GLYCOPYRROLATE 0.2 MG/ML IJ SOLN
INTRAMUSCULAR | Status: DC | PRN
  Administered 2015-06-30: 0.6 mg via INTRAVENOUS

## 2015-06-30 MED ORDER — MORPHINE SULFATE 2 MG/ML IV SOLN
INTRAVENOUS | Status: AC
  Filled 2015-06-30: qty 25

## 2015-06-30 MED ORDER — ROCURONIUM BROMIDE 100 MG/10ML IV SOLN
INTRAVENOUS | Status: DC | PRN
  Administered 2015-06-30: 10 mg via INTRAVENOUS
  Administered 2015-06-30: 40 mg via INTRAVENOUS

## 2015-06-30 MED ORDER — CEFAZOLIN SODIUM-DEXTROSE 2-3 GM-% IV SOLR
2.0000 g | Freq: Three times a day (TID) | INTRAVENOUS | Status: AC
  Administered 2015-06-30: 2 g via INTRAVENOUS
  Filled 2015-06-30: qty 50

## 2015-06-30 MED ORDER — HYDROMORPHONE HCL 1 MG/ML IJ SOLN
0.2500 mg | INTRAMUSCULAR | Status: DC | PRN
  Administered 2015-06-30 (×2): 0.5 mg via INTRAVENOUS

## 2015-06-30 MED ORDER — ROCURONIUM BROMIDE 100 MG/10ML IV SOLN
INTRAVENOUS | Status: AC
  Filled 2015-06-30: qty 1

## 2015-06-30 MED ORDER — PHENYLEPHRINE HCL 10 MG/ML IJ SOLN
INTRAMUSCULAR | Status: DC | PRN
  Administered 2015-06-30 (×3): 40 ug via INTRAVENOUS

## 2015-06-30 MED ORDER — DEXAMETHASONE SODIUM PHOSPHATE 10 MG/ML IJ SOLN
INTRAMUSCULAR | Status: DC | PRN
  Administered 2015-06-30: 10 mg via INTRAVENOUS

## 2015-06-30 MED ORDER — NALOXONE HCL 0.4 MG/ML IJ SOLN: 0.4000 mg | INTRAMUSCULAR | Status: DC | PRN

## 2015-06-30 MED ORDER — METHOCARBAMOL 500 MG PO TABS
500.0000 mg | ORAL_TABLET | Freq: Four times a day (QID) | ORAL | Status: DC | PRN
  Administered 2015-07-01 – 2015-07-03 (×7): 500 mg via ORAL
  Filled 2015-06-30 (×7): qty 1

## 2015-06-30 MED ORDER — GLYCOPYRROLATE 0.2 MG/ML IJ SOLN
INTRAMUSCULAR | Status: AC
  Filled 2015-06-30: qty 3

## 2015-06-30 MED ORDER — FENTANYL CITRATE (PF) 100 MCG/2ML IJ SOLN
INTRAMUSCULAR | Status: AC
  Filled 2015-06-30: qty 2

## 2015-06-30 SURGICAL SUPPLY — 62 items
ADH SKN CLS APL DERMABOND .7 (GAUZE/BANDAGES/DRESSINGS) ×6
APPLIER CLIP LOGIC TI 5 (MISCELLANEOUS) IMPLANT
APR CLP MED LRG 33X5 (MISCELLANEOUS)
BINDER ABDOMINAL 12 ML 46-62 (SOFTGOODS) ×3 IMPLANT
CABLE HIGH FREQUENCY MONO STRZ (ELECTRODE) IMPLANT
CHLORAPREP W/TINT 26ML (MISCELLANEOUS) ×5 IMPLANT
COVER SURGICAL LIGHT HANDLE (MISCELLANEOUS) ×5 IMPLANT
DECANTER SPIKE VIAL GLASS SM (MISCELLANEOUS) ×2 IMPLANT
DERMABOND ADVANCED (GAUZE/BANDAGES/DRESSINGS) ×4
DERMABOND ADVANCED .7 DNX12 (GAUZE/BANDAGES/DRESSINGS) ×2 IMPLANT
DEVICE PMI PUNCTURE CLOSURE (MISCELLANEOUS) ×3 IMPLANT
DEVICE SECURE STRAP 25 ABSORB (INSTRUMENTS) IMPLANT
DEVICE TROCAR PUNCTURE CLOSURE (ENDOMECHANICALS) ×2 IMPLANT
DRAIN CHANNEL 19F RND (DRAIN) IMPLANT
DRAPE INCISE 23X17 IOBAN STRL (DRAPES) ×2
DRAPE INCISE 23X17 STRL (DRAPES) IMPLANT
DRAPE INCISE IOBAN 23X17 STRL (DRAPES) ×3 IMPLANT
DRAPE LAPAROSCOPIC ABDOMINAL (DRAPES) ×5 IMPLANT
DRAPE UTILITY XL STRL (DRAPES) ×5 IMPLANT
DRSG VAC ATS LRG SENSATRAC (GAUZE/BANDAGES/DRESSINGS) ×3 IMPLANT
DRSG VASELINE 3X18 (GAUZE/BANDAGES/DRESSINGS) ×3 IMPLANT
ELECT REM PT RETURN 9FT ADLT (ELECTROSURGICAL) ×5
ELECTRODE REM PT RTRN 9FT ADLT (ELECTROSURGICAL) ×3 IMPLANT
EVACUATOR SILICONE 100CC (DRAIN) IMPLANT
GLOVE BIOGEL M STRL SZ7.5 (GLOVE) ×5 IMPLANT
GOWN STRL REUS W/TWL XL LVL3 (GOWN DISPOSABLE) ×23 IMPLANT
HOLDER FOLEY CATH W/STRAP (MISCELLANEOUS) ×3 IMPLANT
KIT BASIN OR (CUSTOM PROCEDURE TRAY) ×5 IMPLANT
KIT PREVENA INCISION MGT20CM45 (CANNISTER) IMPLANT
LIQUID BAND (GAUZE/BANDAGES/DRESSINGS) IMPLANT
MARKER SKIN DUAL TIP RULER LAB (MISCELLANEOUS) ×5 IMPLANT
MESH ULTRAPRO 3X6 7.6X15CM (Mesh General) ×3 IMPLANT
NDL SPNL 22GX3.5 QUINCKE BK (NEEDLE) ×2 IMPLANT
NEEDLE SPNL 22GX3.5 QUINCKE BK (NEEDLE) IMPLANT
PREVENA INCISION MGT 90 150 (MISCELLANEOUS) ×3 IMPLANT
SCISSORS LAP 5X35 DISP (ENDOMECHANICALS) IMPLANT
SET IRRIG TUBING LAPAROSCOPIC (IRRIGATION / IRRIGATOR) IMPLANT
SHEARS HARMONIC ACE PLUS 36CM (ENDOMECHANICALS) IMPLANT
SLEEVE XCEL OPT CAN 5 100 (ENDOMECHANICALS) ×2 IMPLANT
SOLUTION ANTI FOG 6CC (MISCELLANEOUS) ×2 IMPLANT
SPONGE LAP 18X18 X RAY DECT (DISPOSABLE) ×3 IMPLANT
STAPLER VISISTAT 35W (STAPLE) IMPLANT
SUT ETHILON 2 0 PS N (SUTURE) ×3 IMPLANT
SUT MNCRL AB 4-0 PS2 18 (SUTURE) ×5 IMPLANT
SUT NOVA NAB GS-21 0 18 T12 DT (SUTURE) ×6 IMPLANT
SUT PDS AB 1 TP1 96 (SUTURE) ×6 IMPLANT
SUT PROLENE 1 CTX 30  8455H (SUTURE) ×2
SUT PROLENE 1 CTX 30 8455H (SUTURE) ×1 IMPLANT
SUT SILK 2 0 (SUTURE) ×5
SUT SILK 2-0 18XBRD TIE 12 (SUTURE) ×1 IMPLANT
SUT VIC AB 2-0 SH 18 (SUTURE) ×3 IMPLANT
SUT VIC AB 3-0 SH 18 (SUTURE) ×12 IMPLANT
SYR BULB IRRIGATION 50ML (SYRINGE) ×3 IMPLANT
TOWEL OR 17X26 10 PK STRL BLUE (TOWEL DISPOSABLE) ×5 IMPLANT
TOWEL OR NON WOVEN STRL DISP B (DISPOSABLE) ×5 IMPLANT
TRAY FOLEY W/METER SILVER 14FR (SET/KITS/TRAYS/PACK) ×5 IMPLANT
TRAY FOLEY W/METER SILVER 16FR (SET/KITS/TRAYS/PACK) ×2 IMPLANT
TRAY LAPAROSCOPIC (CUSTOM PROCEDURE TRAY) ×5 IMPLANT
TROCAR BLADELESS OPT 5 100 (ENDOMECHANICALS) ×2 IMPLANT
TROCAR XCEL BLUNT TIP 100MML (ENDOMECHANICALS) IMPLANT
TROCAR XCEL NON-BLD 11X100MML (ENDOMECHANICALS) ×2 IMPLANT
TUBING INSUFFLATION 10FT LAP (TUBING) ×2 IMPLANT

## 2015-06-30 NOTE — Anesthesia Postprocedure Evaluation (Signed)
Anesthesia Post Note  Patient: Leslie Cohen  Procedure(s) Performed: Procedure(s): PANNICULECTOMY APPLICATION OF WOUND VAC HERNIA REPAIR VENTRAL ADULT WITH MESH INSERTION OF MESH  Patient location during evaluation: PACU Anesthesia Type: General Level of consciousness: awake and alert Pain management: pain level controlled Vital Signs Assessment: post-procedure vital signs reviewed and stable Respiratory status: spontaneous breathing, nonlabored ventilation, respiratory function stable and patient connected to nasal cannula oxygen Cardiovascular status: blood pressure returned to baseline and stable Postop Assessment: no signs of nausea or vomiting Anesthetic complications: no    Last Vitals:  Filed Vitals:   06/30/15 1319 06/30/15 1414  BP: 109/51 109/59  Pulse: 62 60  Temp: 36.9 C 37.4 C  Resp: 12 13    Last Pain:  Filed Vitals:   06/30/15 1414  PainSc: Asleep                 Jonthan Leite J

## 2015-06-30 NOTE — Op Note (Deleted)
NAMEANWEN, CANNEDY             ACCOUNT NO.:  [id number]  MEDICAL RECORD NO.:  [id number]  LOCATION:                               FACILITY:  [facility]  PHYSICIAN:  Leighton Ruff. Redmond Pulling, MD, FACSDATE OF BIRTH:  [birth year]  DATE OF PROCEDURE:  06/30/2015 DATE OF DISCHARGE:  07/03/2015                              OPERATIVE REPORT   PREOPERATIVE DIAGNOSIS:  Ventral incisional hernia.  POSTOPERATIVE DIAGNOSIS:  Ventral incisional hernia.  PROCEDURE: 1. Open retrorectus incisional hernia repair with mesh. 2. Panniculectomy. 3. Application of skin wound VAC less than 50 square cm.  SURGEON:  Leighton Ruff. Redmond Pulling, MD, FACS.  ASSISTANT SURGEON:  Marland Kitchen T. Hoxworth, M.D.  ESTIMATED BLOOD LOSS:  Minimal.  SPECIMEN:  Abdominal wall skin and subcutaneous fat and hernia sac.  INDICATIONS FOR PROCEDURE:  The patient is a very pleasant 39 year old female, who has a known periumbilical incisional hernia from her prior cholecystectomy several years ago.  When I initially met her last year, she was morbidly obese.  She elected to undergo a weight loss surgery. 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  Her initial BMI was 45 with a weight of 242 pounds.  She has been very successful over the past 7 months.  She has lost over 80 pounds and now has a BMI of around 30.  However, her periumbilical ventral incisional hernia has become more symptomatic.  She has had several episodes of incarceration. She presented for repair.  She has a fair amount of redundant abdominal wall skin which we discussed would potentially compromise her hernia repair.  We discussed at length different repair approaches for her ventral incisional hernia in addition to management of the redundant skin.  Her pannus did hang down below her pubis; however, it was also quite redundant in the periumbilical area where her hernia was located. I felt that if we did not resect the skin and just left all this redundant skin,  it would compromise the integrity of the abdominal skin closure due to lack of adequate blood supply after we mobilize the subcutaneous fat off the fascia for the hernia repair.  Therefore, decided to proceed with panniculectomy in order to give her a good outcome for her ventral hernia repair.  She was also having pain from the excess skin with it hanging down below the level of her  pubis.  We discussed at length the risks and benefits of the surgery on 2 separate occasions in the office.  Please see my office notes regarding those details.  DESCRIPTION OF PROCEDURE:  After obtaining informed consent, she was taken to the OR 4 at [facility] placed supine on the operating table.  General endotracheal anesthesia was established.  Foley catheter was placed.  Her abdomen was prepped extensively with ChloraPrep.  She received IV antibiotics prior to skin incision.  A surgical time-out was performed.  Her incisional hernia was mainly in the supraumbilical location.  On CT from last year, it measured approximately 4 x 4 cm.  Given the amount of redundant skin that she had in this area, I felt that the best way to approach this was in fact to do a panniculectomy.  I felt if we did not remove this excess skin, it would compromise the integrity of her repair potentially leading to infection and mesh complications.  The skin was outlined with a marking pen in an oblique fashion incorporating her umbilicus extending downward toward her pubis and extended out laterally.  This was then up in an oblong transverse diamond configuration.  It extended from 1 side of the abdomen to the other at the level of the anterior suprailiac spine, but higher up.  The subcutaneous tissue was divided with electrocautery ensuring that we did not scave underneath the flap.  We get down to hernia sac in the supraumbilical position.  I got inside and opened that up.  We then excised the skin down to the  abdominal wall muscle leaving a little bit of fat on the abdominal wall muscle.  The skin was excised in its entirety.  There is  adequate remaining skin flap for closure.  This left our abdominal wall hernia.  There was still some hernia sac left. It was excised from the surrounding subcutaneous tissue and skin with Bovie electrocautery.  Our defect had some omentum coming through it. The adhesions to the fascia edges were taken down and the omentum was returned to the abdomen.  This left a fascial defect of approximately 4 cm vertical x 3.5 cm wide.  I decided to do a Rives-Stoppa repair a.k.a. retrorectus repair.  Kocher's were placed on the fascial edges.  I did end up incising the hernia and the fascia a little bit more superior and inferiorly in order to open up the space a little bit more.  The posterior rectus fascia was incised about 1 cm from the fascial edge.  I got into the avascular plane and took it down out laterally toward [place] all until we came across the neurovascular bundles.  The same procedure was done on the contralateral side.  I then brought the posterior fascia together in the upper midline and lower midline.  I did not feel like we needed to do transversalis abdominal releases because I was able to bring the fascia together at this point.  The posterior fascia was then reapproximated with a #1 running Prolene suture taking small bites of 1 from above and 1 from below.  This left a retrorectus space of approximately 15 cm x about 7 cm wide.  I obtained a piece of Ethicon Ultrapro mesh measuring 7 cm wide x 15 cm long.  Using a #0 Novafil suture, I placed it through the mesh at the top and tied down the suture.  Then using a suture passer, I brought it out through the apex at the vertical aspect of the anterior fascia in the upper midline, so that it was passed transfascial.  This was anchored with hemostat.  I then placed another suture at the inferior aspect of the  mesh and brought out through the lower anterior fascia using the suture passer. We brought up the superior and inferior ends of the mesh.  It appeared that there was no redundancy in the mesh.  These 2 ends were then tied down.  I then placed 3 interrupted Novafil sutures along the right side of the mesh and brought it up transfascially using the suture passer. We then brought up and secured the left side of the mesh in a similar fashion using 3 interrupted transfascial sutures.  The mesh was well anchored to the abdominal wall fascia.  There was no redundancy in the mesh.  We then irrigated the retrorectus space with antibiotic irrigation.  I then closed the anterior fascia with a running looped #1 PDS, 1 from above and 1 from below.  I did imbricate some of the lower abdominal wall fascia to take out some of the redundancy in the muscle. The sutures were then tied centrally.  There was no mesh exposed.  The mesh was well covered.  We then irrigated the subcutaneous tissue with antibiotic irrigation.  We then closed the skin and subcutaneous fat in a transverse fashion using multiple interrupted 3-0 Vicryl sutures and Scarpa's fascia was closed in 1 layer and then a more superficial layer was brought together with interrupted inverted 3-0 Vicryl sutures.  The skin was well vascularized.  There was no evidence of skin necrosis. The skin was then closed with skin staples.  I then placed plastic skin drapes on the surrounding skin just next to the skin staples.  I then placed a piece of Vaseline gauze over the skin staples, then a black sponge that had been trimmed to cover the length of the incision to serve as an incisional wound VAC, and then we placed the plastic drape and suction device and we had an adequate seal.  Small piece of Dermabond was then applied to the transfascial suture site in the upper midline where the mesh had been secured in the upper midline.  An abdominal binder  was then placed.  All needle, instrument, sponge counts were correct x2.  There were no immediate complications.  The patient tolerated the procedure well.  She was taken to recovery room in stable condition.     Leighton Ruff. Redmond Pulling, MD, FACS     EMW/MEDQ  D:  06/30/2015  T:  06/30/2015  Job:  [id number]

## 2015-06-30 NOTE — Brief Op Note (Signed)
06/30/2015  10:43 AM  PATIENT:  Leslie Cohen  39 y.o. female  PRE-OPERATIVE DIAGNOSIS:  Ventral incisional hernia  POST-OPERATIVE DIAGNOSIS:  ventral incisional hernia  PROCEDURE:  Procedure(s): PANNICULECTOMY APPLICATION OF INCISIONAL WOUND VAC OPEN RETRORECTUS INCISIONAL HERNIA REPAIR WITH MESH INSERTION OF MESH  SURGEON:  Surgeon(s) and Role:    * Gaynelle AduEric Ivah Girardot, MD - Primary    * Glenna FellowsBenjamin Hoxworth, MD - Assisting  PHYSICIAN ASSISTANT:   ASSISTANTS: see above   ANESTHESIA:   general  EBL:  Total I/O In: 2600 [I.V.:2600] Out: 250 [Urine:200; Blood:50]  BLOOD ADMINISTERED:none  DRAINS: none   LOCAL MEDICATIONS USED:  NONE  SPECIMEN:  Source of Specimen:  abdominal skin, subcu tissue, hernia sac  DISPOSITION OF SPECIMEN:  PATHOLOGY  COUNTS:  YES  TOURNIQUET:  * No tourniquets in log *  DICTATION: .Other Dictation: Dictation Number [id number]  PLAN OF CARE: Admit to inpatient   PATIENT DISPOSITION:  PACU - hemodynamically stable.   Delay start of Pharmacological VTE agent (>24hrs) due to surgical blood loss or risk of bleeding: no  [name] Andrey CampanileWilson, MD, FACS General, Bariatric, & Minimally Invasive Surgery [facility], [place]

## 2015-06-30 NOTE — Anesthesia Procedure Notes (Signed)
Procedure Name: Intubation Date/Time: 06/30/2015 7:35 AM Performed by: Montel Clock Pre-anesthesia Checklist: Patient identified, Emergency Drugs available, Suction available, Patient being monitored and Timeout performed Patient Re-evaluated:Patient Re-evaluated prior to inductionOxygen Delivery Method: Circle system utilized Preoxygenation: Pre-oxygenation with 100% oxygen Intubation Type: IV induction, Rapid sequence and Cricoid Pressure applied Laryngoscope Size: Mac and 3 Grade View: Grade II Tube type: Oral Tube size: 7.0 mm Number of attempts: 1 Airway Equipment and Method: Stylet and LTA kit utilized Placement Confirmation: ETT inserted through vocal cords under direct vision,  positive ETCO2 and breath sounds checked- equal and bilateral Secured at: 21 cm Tube secured with: Tape Dental Injury: Teeth and Oropharynx as per pre-operative assessment

## 2015-06-30 NOTE — Transfer of Care (Signed)
Immediate Anesthesia Transfer of Care Note  Patient: Leslie AnisChristine M Beehler  Procedure(s) Performed: Procedure(s): PANNICULECTOMY APPLICATION OF WOUND VAC HERNIA REPAIR VENTRAL ADULT WITH MESH INSERTION OF MESH  Patient Location: PACU  Anesthesia Type:General  Level of Consciousness: sedated  Airway & Oxygen Therapy: Patient Spontanous Breathing and Patient connected to face mask oxygen  Post-op Assessment: Report given to RN and Post -op Vital signs reviewed and stable  Post vital signs: Reviewed and stable  Last Vitals:  Filed Vitals:   06/30/15 0556  BP: 105/65  Pulse: 80  Temp: 36.5 C  Resp: 16    Complications: No apparent anesthesia complications

## 2015-06-30 NOTE — Interval H&P Note (Signed)
History and Physical Interval Note:  06/30/2015 7:18 AM  Leslie Cohen  has presented today for surgery, with the diagnosis of Morbid Obesity  The various methods of treatment have been discussed with the patient and family. After consideration of risks, benefits and other options for treatment, the patient has consented to  Procedure(s): Open repair of  VENTRAL incisional HERNIA with mesh (N/A) as a surgical intervention .  The patient's history has been reviewed, patient examined, no change in status, stable for surgery.  I have reviewed the patient's chart and labs.  Questions were answered to the patient's satisfaction.    [name] Andrey CampanileWilson, MD, FACS General, Bariatric, & Minimally Invasive Surgery [facility], [place]   [facility] Center[facility]IL[facility],Hendy Brindle M

## 2015-06-30 NOTE — H&P (Signed)
Leslie Cohen 05/19/2015 10:56 AM Location: [facility] Patient #: [id number] DOB: 02/19/1976 Married / Language: English / Race: White Female   History of Present Illness Leslie Cohen(Leslie Cohen Leslie. Leslie Mincey MD; 05/19/2015 2:08 PM) Patient words: reck.  The patient is a 39 year old female presenting status-post bariatric surgery. She underwent laparoscopic sleeve gastrectomy with hiatal hernia repair on May 2. She was discharged home on May 5. Her preoperative weight was 243 pounds. I last saw her 6 weeks ago. Her weight at that time was 182 pounds. She states that she is doing well. She is accompanied by her husband today. She reports 2 episodes where her hernia got temporarily stuck. It was quite hard and tender with nausea and vomiting. On one occasion she actually drive to the emergency room however follow in the parking lot and reduced and her symptoms gradually resolved. This occurred on October 29. She had another episode yesterday where her hernia got very tender and sore but eventually went away. Otherwise, She denies any nausea, vomiting, heartburn, abdominal pain. She denies any lightheadedness or dizziness. She is drinking plenty of water. She is getting in about 50 g of protein. She is taking her supplements including all calcium dosages. She denies any melanoma or hematochezia. She denies any food intolerance. She is walking at work. She is currently working 3rd shift. She is averaging 7000 8000 steps per day. She is walking at night with a coworker for 20 minutes. She is also swimming in her pool for about 15 minutes at a time. She does have some ongoing hair loss  Review of systems-a 12 point review of systems is performed, all systems are negative except for what is mentioned in the HPI   Problem List/Past Medical Leslie Cohen(Leslie Cohen Leslie Leslie Connon, MD; 05/19/2015 2:12 PM) INCISIONAL HERNIA, WITHOUT OBSTRUCTION OR GANGRENE (K43.2) OBESITY (BMI 30-39.9) (E66.9) GASTROESOPHAGEAL  REFLUX DISEASE, ESOPHAGITIS PRESENCE NOT SPECIFIED (K21.9) CHRONIC ASTHMA, UNSPECIFIED ASTHMA SEVERITY, UNCOMPLICATED (J45.909) S/P LAPAROSCOPIC SLEEVE GASTRECTOMY (Z98.84)11/02/2014 with hiatal hernia  Other Problems Leslie Cohen(Leslie Cohen Leslie Khamil Lamica, MD; 05/19/2015 2:12 PM) Depression Anxiety Disorder Cholelithiasis Umbilical Hernia Repair Kidney Stone  Past Surgical History Leslie Cohen(Leslie Cohen Leslie Masaji Billups, MD; 05/19/2015 2:12 PM) Gallbladder Surgery - Laparoscopic Cesarean Section - Multiple  Diagnostic Studies History Leslie Cohen(Jathniel Smeltzer Leslie Rivky Clendenning, MD; 05/19/2015 2:12 PM) Colonoscopy never Pap Smear 1-5 years ago Mammogram never  Allergies Leslie Cohen(Leslie Cohen, Cohen; 05/19/2015 10:58 AM) Barium Chloride Dihydrate *CHEMICALS* Biaxin *MACROLIDES* Demerol *ANALGESICS - OPIOID* No Known Drug Allergies05/19/2016 (Marked as Inactive)  Medication History Leslie Cohen(Leslie Cohen Leslie Donalda Job, MD; 05/19/2015 2:12 PM) ZyrTEC Allergy (10MG  Capsule, Oral daily) Active. Citalopram Hydrobromide (20MG  Tablet, Oral as needed) Active. Medications Reconciled Multiple Vitamin (Oral) Active. Calcium Carbonate (1250MG  Tablet, Oral) Active. Protonix (40MG  Packet, Oral) Active. RA [employer] (Oral daily) Active.  Social History Leslie Cohen(Leslie Cohen Leslie Leslie Koslow, MD; 05/19/2015 2:12 PM) Tobacco use Never smoker. No drug use Caffeine use Carbonated beverages. Alcohol use Occasional alcohol use.  Family History Leslie Cohen(Leslie Cohen Leslie Leslie Ballowe, MD; 05/19/2015 2:12 PM) Arthritis Father. Respiratory Condition Father, Mother. Heart disease in female family member before age 39 Depression Father. Hypertension Father. Alcohol Abuse Father.  Pregnancy / Birth History Leslie Cohen(Katelind Cohen Leslie Braheem Tomasik, MD; 05/19/2015 2:12 PM) Gravida 3 Maternal age 39-20 Para 3 Irregular periods Contraceptive History Oral contraceptives. Age at menarche 10 years.  Vitals (Leslie Cohen; 05/19/2015 10:57 AM) 05/19/2015 10:57 AM Weight: 168 lb Height: 61in Body Surface Area: 1.75  Leslie Body Mass Index: 31.74 kg/Leslie  Temp.: 25F(Temporal)  Pulse: 76 (Regular)  BP: 136/78 (Sitting, Left  Arm, Standard)       Physical Exam Leslie Cohen Leslie. Leslie Shuford MD; 05/19/2015 2:04 PM) General Mental Status-Alert. General Appearance-Consistent with stated age. Hydration-Well hydrated. Voice-Normal.  Head and Neck Head-normocephalic, atraumatic with no lesions or palpable masses. Trachea-midline. Thyroid Gland Characteristics - normal size and consistency.  Eye Eyeball - Bilateral-Extraocular movements intact. Sclera/Conjunctiva - Bilateral-No scleral icterus.  Chest and Lung Exam Chest and lung exam reveals -quiet, even and easy respiratory effort with no use of accessory muscles and on auscultation, normal breath sounds, no adventitious sounds and normal vocal resonance. Inspection Chest Wall - Normal. Back - normal.  Breast - Did not examine.  Cardiovascular Cardiovascular examination reveals -normal heart sounds, regular rate and rhythm with no murmurs and normal pedal pulses bilaterally.  Abdomen Inspection Inspection of the abdomen reveals - No Hernias. Skin - Scar - Note: well healed trocar sites; supraumbilical hernia, size about 5cm. soft, reducible. large amount of redundant skin/pannus. Palpation/Percussion Palpation and Percussion of the abdomen reveal - Soft, Non Tender, No Rebound tenderness, No Rigidity (guarding) and No hepatosplenomegaly. Auscultation Auscultation of the abdomen reveals - Bowel sounds normal.  Peripheral Vascular Upper Extremity Palpation - Pulses bilaterally normal.  Neurologic Neurologic evaluation reveals -alert and oriented x 3 with no impairment of recent or remote memory. Mental Status-Normal.  Neuropsychiatric The patient's mood and affect are described as -normal. Judgment and Insight-insight is appropriate concerning matters relevant to self.  Musculoskeletal Normal Exam - Left-Upper  Extremity Strength Normal and Lower Extremity Strength Normal. Normal Exam - Right-Upper Extremity Strength Normal and Lower Extremity Strength Normal.  Lymphatic Head & Neck  General Head & Neck Lymphatics: Bilateral - Description - Normal. Axillary - Did not examine. Femoral & Inguinal - Did not examine.    Assessment & Plan Leslie Cohen Leslie. Brookelyn Gaynor MD; 05/19/2015 2:12 PM) S/P LAPAROSCOPIC SLEEVE GASTRECTOMY (Z98.84) Story: with hiatal hernia Impression: She looks really good. She was given a lab requisition slips. We discussed the importance of proper food choices. We discussed the importance of taking her supplements. She now has a lot of redundant skin in her abdominal wall that must be addressed at the time of her open ventral incisional hernia repair Current Plans Pt Education - EMW_bariatricFU INCISIONAL HERNIA, WITHOUT OBSTRUCTION OR GANGRENE (K43.2) Impression: She has lost about 75 pounds since surgery. Her BMI is no longer in the 40s. It appears that she has had 2 cases or episodes of incarceration. We rediscussed signs and symptoms of incarceration and strangulation. Right now we are in the process of planning open ventral hernia repair with mesh and panniculectomy. I believe that she needs and will have to have a panniculectomy in order to approach her ventral incisional hernia. We rediscussed the steps of the procedure today along with the risk and benefits.  The patient has elected to proceed with open ventral incisional hernia repair with mesh, possible panniculectomy.  We discussed the risk and benefits of surgery including but not limited to bleeding, infection, injury to surrounding structures, hernia recurrence, mesh complications, hematoma/seroma formation, loss of umbilicus, blood clot formation, urinary retention, post operative ileus, general anesthesia risk, long-term abdominal pain. We discussed that this procedure can be quite uncomfortable and difficult to recover from  based on how the mesh is secured to the abdominal wall. We discussed the importance of avoiding heavy lifting and straining for a period of 6 weeks. Current Plans Pt Education - Pamphlet Given - Hernia Surgery: discussed with patient and provided information. OBESITY (BMI 30-39.9) (E66.9) Impression:  I congratulated her on her weight loss. She has lost roughly 75 pounds. We discussed the importance of ongoing physical activity. I encouraged her to change at the activity while she is walking.    Mary Sella. Andrey Campanile, MD, FACS General, Bariatric, & Minimally Invasive Surgery [facility], [place]

## 2015-06-30 NOTE — Op Note (Signed)
NAME:  Leslie Cohen, Leslie Cohen             ACCOUNT NO.:  [id number]  MEDICAL RECORD NO.:  [id number]  LOCATION:                               FACILITY:  [facility]  PHYSICIAN:  Sybol Morre M. Arnett Duddy, MD, FACSDATE OF BIRTH:  04/18/1976  DATE OF PROCEDURE:  06/30/2015 DATE OF DISCHARGE:  07/03/2015                              OPERATIVE REPORT   PREOPERATIVE DIAGNOSIS:  Ventral incisional hernia.  POSTOPERATIVE DIAGNOSIS:  Ventral incisional hernia.  PROCEDURE: 1. Open retrorectus incisional hernia repair with mesh. 2. Panniculectomy. 3. Application of skin wound VAC less than 50 square cm.  SURGEON:  Kashauna Celmer M. Avaree Gilberti, MD, FACS.  ASSISTANT SURGEON:  Benjamin T. Hoxworth, M.D.  ESTIMATED BLOOD LOSS:  Minimal.  SPECIMEN:  Abdominal wall skin and subcutaneous fat and hernia sac.  INDICATIONS FOR PROCEDURE:  The patient is a very pleasant 39-year-old female, who has a known periumbilical incisional hernia from her prior cholecystectomy several years ago.  When I initially met her last year, she was morbidly obese.  She elected to undergo a weight loss surgery. 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  Her initial BMI was 45 with a weight of 242 pounds.  She has been very successful over the past 7 months.  She has lost over 80 pounds and now has a BMI of around 30.  However, her periumbilical ventral incisional hernia has become more symptomatic.  She has had several episodes of incarceration. She presented for repair.  She has a fair amount of redundant abdominal wall skin which we discussed would potentially compromise her hernia repair.  We discussed at length different repair approaches for her ventral incisional hernia in addition to management of the redundant skin.  Her pannus did hang down below her pubis; however, it was also quite redundant in the periumbilical area where her hernia was located. I felt that if we did not resect the skin and just left all this redundant skin,  it would compromise the integrity of the abdominal skin closure due to lack of adequate blood supply after we mobilize the subcutaneous fat off the fascia for the hernia repair.  Therefore, decided to proceed with panniculectomy in order to give her a good outcome for her ventral hernia repair.  She was also having pain from the excess skin with it hanging down below the level of her  pubis.  We discussed at length the risks and benefits of the surgery on 2 separate occasions in the office.  Please see my office notes regarding those details.  DESCRIPTION OF PROCEDURE:  After obtaining informed consent, she was taken to the OR 4 at [facility] placed supine on the operating table.  General endotracheal anesthesia was established.  Foley catheter was placed.  Her abdomen was prepped extensively with ChloraPrep.  She received IV antibiotics prior to skin incision.  A surgical time-out was performed.  Her incisional hernia was mainly in the supraumbilical location.  On CT from last year, it measured approximately 4 x 4 cm.  Given the amount of redundant skin that she had in this area, I felt that the best way to approach this was in fact to do a panniculectomy.    I felt if we did not remove this excess skin, it would compromise the integrity of her repair potentially leading to infection and mesh complications.  The skin was outlined with a marking pen in an oblique fashion incorporating her umbilicus extending downward toward her pubis and extended out laterally.  This was then up in an oblong transverse diamond configuration.  It extended from 1 side of the abdomen to the other at the level of the anterior suprailiac spine, but higher up.  The subcutaneous tissue was divided with electrocautery ensuring that we did not scave underneath the flap.  We get down to hernia sac in the supraumbilical position.  I got inside and opened that up.  We then excised the skin down to the  abdominal wall muscle leaving a little bit of fat on the abdominal wall muscle.  The skin was excised in its entirety.  There is  adequate remaining skin flap for closure.  This left our abdominal wall hernia.  There was still some hernia sac left. It was excised from the surrounding subcutaneous tissue and skin with Bovie electrocautery.  Our defect had some omentum coming through it. The adhesions to the fascia edges were taken down and the omentum was returned to the abdomen.  This left a fascial defect of approximately 4 cm vertical x 3.5 cm wide.  I decided to do a Rives-Stoppa repair a.k.a. retrorectus repair.  Kocher's were placed on the fascial edges.  I did end up incising the hernia and the fascia a little bit more superior and inferiorly in order to open up the space a little bit more.  The posterior rectus fascia was incised about 1 cm from the fascial edge.  I got into the avascular plane and took it down out laterally toward us all until we came across the neurovascular bundles.  The same procedure was done on the contralateral side.  I then brought the posterior fascia together in the upper midline and lower midline.  I did not feel like we needed to do transversalis abdominal releases because I was able to bring the fascia together at this point.  The posterior fascia was then reapproximated with a #1 running Prolene suture taking small bites of 1 from above and 1 from below.  This left a retrorectus space of approximately 15 cm x about 7 cm wide.  I obtained a piece of Ethicon Ultrapro mesh measuring 7 cm wide x 15 cm long.  Using a #0 Novafil suture, I placed it through the mesh at the top and tied down the suture.  Then using a suture passer, I brought it out through the apex at the vertical aspect of the anterior fascia in the upper midline, so that it was passed transfascial.  This was anchored with hemostat.  I then placed another suture at the inferior aspect of the  mesh and brought out through the lower anterior fascia using the suture passer. We brought up the superior and inferior ends of the mesh.  It appeared that there was no redundancy in the mesh.  These 2 ends were then tied down.  I then placed 3 interrupted Novafil sutures along the right side of the mesh and brought it up transfascially using the suture passer. We then brought up and secured the left side of the mesh in a similar fashion using 3 interrupted transfascial sutures.  The mesh was well anchored to the abdominal wall fascia.  There was no redundancy in the mesh.    We then irrigated the retrorectus space with antibiotic irrigation.  I then closed the anterior fascia with a running looped #1 PDS, 1 from above and 1 from below.  I did imbricate some of the lower abdominal wall fascia to take out some of the redundancy in the muscle. The sutures were then tied centrally.  There was no mesh exposed.  The mesh was well covered.  We then irrigated the subcutaneous tissue with antibiotic irrigation.  We then closed the skin and subcutaneous fat in a transverse fashion using multiple interrupted 3-0 Vicryl sutures and Scarpa's fascia was closed in 1 layer and then a more superficial layer was brought together with interrupted inverted 3-0 Vicryl sutures.  The skin was well vascularized.  There was no evidence of skin necrosis. The skin was then closed with skin staples.  I then placed plastic skin drapes on the surrounding skin just next to the skin staples.  I then placed a piece of Vaseline gauze over the skin staples, then a black sponge that had been trimmed to cover the length of the incision to serve as an incisional wound VAC, and then we placed the plastic drape and suction device and we had an adequate seal.  Small piece of Dermabond was then applied to the transfascial suture site in the upper midline where the mesh had been secured in the upper midline.  An abdominal binder  was then placed.  All needle, instrument, sponge counts were correct x2.  There were no immediate complications.  The patient tolerated the procedure well.  She was taken to recovery room in stable condition.     Iridian Reader M. Devontay Celaya, MD, FACS     EMW/MEDQ  D:  06/30/2015  T:  06/30/2015  Job:  [id number] 

## 2015-07-01 LAB — CBC
HCT: 32.2 % — ABNORMAL LOW (ref 36.0–46.0)
Hemoglobin: 10.6 g/dL — ABNORMAL LOW (ref 12.0–15.0)
MCH: 30 pg (ref 26.0–34.0)
MCHC: 32.9 g/dL (ref 30.0–36.0)
MCV: 91.2 fL (ref 78.0–100.0)
PLATELETS: 323 10*3/uL (ref 150–400)
RBC: 3.53 MIL/uL — ABNORMAL LOW (ref 3.87–5.11)
RDW: 13.1 % (ref 11.5–15.5)
WBC: 11.2 10*3/uL — AB (ref 4.0–10.5)

## 2015-07-01 LAB — HEMOGLOBIN AND HEMATOCRIT, BLOOD
HCT: 34.9 % — ABNORMAL LOW (ref 36.0–46.0)
Hemoglobin: 11.3 g/dL — ABNORMAL LOW (ref 12.0–15.0)

## 2015-07-01 LAB — BASIC METABOLIC PANEL
Anion gap: 4 — ABNORMAL LOW (ref 5–15)
BUN: 7 mg/dL (ref 6–20)
CALCIUM: 8.5 mg/dL — AB (ref 8.9–10.3)
CO2: 32 mmol/L (ref 22–32)
Chloride: 104 mmol/L (ref 101–111)
Creatinine, Ser: 0.54 mg/dL (ref 0.44–1.00)
GFR calc Af Amer: >60 mL/min (ref >60)
GLUCOSE: 123 mg/dL — AB (ref 65–99)
Potassium: 4.7 mmol/L (ref 3.5–5.1)
Sodium: 140 mmol/L (ref 135–145)

## 2015-07-01 MED ORDER — HYDROXYZINE HCL 25 MG PO TABS
25.0000 mg | ORAL_TABLET | Freq: Three times a day (TID) | ORAL | Status: DC | PRN
  Administered 2015-07-01 – 2015-07-02 (×3): 25 mg via ORAL
  Filled 2015-07-01 (×4): qty 1

## 2015-07-01 MED ORDER — DIPHENHYDRAMINE HCL 50 MG/ML IJ SOLN
12.5000 mg | Freq: Four times a day (QID) | INTRAMUSCULAR | Status: DC | PRN
Start: 2015-07-01 — End: 2015-07-03
  Administered 2015-07-01 (×2): 12.5 mg via INTRAVENOUS
  Filled 2015-07-01 (×3): qty 1

## 2015-07-01 MED ORDER — OXYCODONE HCL 5 MG PO TABS
5.0000 mg | ORAL_TABLET | ORAL | Status: DC | PRN
  Administered 2015-07-01 – 2015-07-03 (×10): 10 mg via ORAL
  Filled 2015-07-01 (×10): qty 2
  Filled 2015-07-01: qty 1

## 2015-07-01 MED ORDER — HYDROMORPHONE HCL 1 MG/ML IJ SOLN
1.0000 mg | INTRAMUSCULAR | Status: DC | PRN
  Administered 2015-07-02 (×2): 1 mg via INTRAVENOUS
  Filled 2015-07-01 (×3): qty 1

## 2015-07-01 NOTE — Progress Notes (Signed)
1 Day Post-Op  Subjective: LOTS of itching. Itching so bad couldn't sleep. Pain ok. Has pulling sensation on right. No n/v. Ambulated. Doing breathing exercises  Objective: Vital signs in last 24 hours: Temp:  [97.5 F (36.4 C)-99.4 F (37.4 C)] 98.1 F (36.7 C) (12/29 0445) Pulse Rate:  [46-77] 47 (12/29 0445) Resp:  [8-29] 16 (12/29 0445) BP: (93-116)/(43-70) 93/43 mmHg (12/29 0445) SpO2:  [94 %-100 %] 94 % (12/29 0445)    Intake/Output from previous day: 12/28 0701 - 12/29 0700 In: 3815 [I.V.:3815] Out: 2260 [Urine:2210; Blood:50] Intake/Output this shift:    Alert, sitting on edge of bed, eating breakfast cta b/l Reg Soft, nd, wound vac intact; +binder  Lab Results:   Recent Labs  07/01/15 0547  WBC 11.2*  HGB 10.6*  HCT 32.2*  PLT 323   BMET  Recent Labs  07/01/15 0547  NA 140  K 4.7  CL 104  CO2 32  GLUCOSE 123*  BUN 7  CREATININE 0.54  CALCIUM 8.5*   PT/INR No results for input(s): LABPROT, INR in the last 72 hours. ABG No results for input(s): PHART, HCO3 in the last 72 hours.  Invalid input(s): PCO2, PO2  Studies/Results: No results found.  Anti-infectives: Anti-infectives    Start     Dose/Rate Route Frequency Ordered Stop   06/30/15 1600  ceFAZolin (ANCEF) IVPB 2 g/50 mL premix     2 g 100 mL/hr over 30 Minutes Intravenous 3 times per day 06/30/15 1223 06/30/15 1622   06/30/15 0840  polymyxin B 500,000 Units, bacitracin 50,000 Units in sodium chloride irrigation 0.9 % 500 mL irrigation  Status:  Discontinued       As needed 06/30/15 0840 06/30/15 1029   06/30/15 0552  ceFAZolin (ANCEF) IVPB 2 g/50 mL premix     2 g 100 mL/hr over 30 Minutes Intravenous On call to O.R. 06/30/15 0552 06/30/15 0740      Assessment/Plan: s/p Procedure(s): PANNICULECTOMY APPLICATION OF WOUND VAC HERNIA REPAIR VENTRAL ADULT WITH MESH INSERTION OF MESH  Doing welll. Adv diet as tolerated Dc morphine pca - itching? Add atarax for itching Add po  pain medication Dc foley Discussed intraop findings and procedure Scds, subcu heparin Monitor hgb. Drop today. Dilutional?;  Repeat hgb later today  [name] Andrey CampanileWilson, MD, FACS General, Bariatric, & Minimally Invasive Surgery [facility], [place]   LOS: 1 day    [name],Dezman Granda M 07/01/2015

## 2015-07-02 ENCOUNTER — Encounter (HOSPITAL_COMMUNITY): Payer: Self-pay | Admitting: General Surgery

## 2015-07-02 LAB — CBC
HCT: 30.4 % — ABNORMAL LOW (ref 36.0–46.0)
Hemoglobin: 10.1 g/dL — ABNORMAL LOW (ref 12.0–15.0)
MCH: 30.3 pg (ref 26.0–34.0)
MCHC: 33.2 g/dL (ref 30.0–36.0)
MCV: 91.3 fL (ref 78.0–100.0)
PLATELETS: 294 10*3/uL (ref 150–400)
RBC: 3.33 MIL/uL — ABNORMAL LOW (ref 3.87–5.11)
RDW: 13.1 % (ref 11.5–15.5)
WBC: 8.2 10*3/uL (ref 4.0–10.5)

## 2015-07-02 LAB — BASIC METABOLIC PANEL
ANION GAP: 6 (ref 5–15)
BUN: 15 mg/dL (ref 6–20)
CALCIUM: 8.4 mg/dL — AB (ref 8.9–10.3)
CO2: 30 mmol/L (ref 22–32)
CREATININE: 0.49 mg/dL (ref 0.44–1.00)
Chloride: 104 mmol/L (ref 101–111)
GFR calc Af Amer: >60 mL/min (ref >60)
GLUCOSE: 89 mg/dL (ref 65–99)
Potassium: 4 mmol/L (ref 3.5–5.1)
Sodium: 140 mmol/L (ref 135–145)

## 2015-07-02 MED ORDER — SIMETHICONE 80 MG PO CHEW
80.0000 mg | CHEWABLE_TABLET | Freq: Once | ORAL | Status: AC
  Administered 2015-07-02: 80 mg via ORAL
  Filled 2015-07-02 (×2): qty 1

## 2015-07-02 MED ORDER — POLYETHYLENE GLYCOL 3350 17 G PO PACK
17.0000 g | PACK | Freq: Every day | ORAL | Status: DC
  Administered 2015-07-02: 17 g via ORAL
  Filled 2015-07-02: qty 1

## 2015-07-02 MED ORDER — SIMETHICONE 80 MG PO CHEW
80.0000 mg | CHEWABLE_TABLET | Freq: Every day | ORAL | Status: DC
  Filled 2015-07-02: qty 1

## 2015-07-02 NOTE — Progress Notes (Signed)
2 Days Post-Op  Subjective: Pt seen and examined at noon.  Didn't walk much yesterday or this am. No n/v. But burping. No flatus. No bm.   Objective: Vital signs in last 24 hours: Temp:  [98.1 F (36.7 C)-98.8 F (37.1 C)] 98.6 F (37 C) (12/30 1434) Pulse Rate:  [60-89] 82 (12/30 1434) Resp:  [15-17] 15 (12/30 1434) BP: (101-108)/(45-60) 103/46 mmHg (12/30 1434) SpO2:  [95 %-99 %] 97 % (12/30 1434)    Intake/Output from previous day: 12/29 0701 - 12/30 0700 In: 1762.5 [P.O.:240; I.V.:1522.5] Out: 1225 [Urine:1225] Intake/Output this shift: Total I/O In: 337.5 [P.O.:120; I.V.:217.5] Out: 600 [Urine:600]  Alert, nad, nontoxic cta Reg Bloated, soft, mild expected TTP along lower abd transverse incision. Incisional wound vac removed. No cellulitis.   Lab Results:   Recent Labs  07/01/15 0547 07/01/15 1414 07/02/15 0528  WBC 11.2*  --  8.2  HGB 10.6* 11.3* 10.1*  HCT 32.2* 34.9* 30.4*  PLT 323  --  294   BMET  Recent Labs  07/01/15 0547 07/02/15 0528  NA 140 140  K 4.7 4.0  CL 104 104  CO2 32 30  GLUCOSE 123* 89  BUN 7 15  CREATININE 0.54 0.49  CALCIUM 8.5* 8.4*   PT/INR No results for input(s): LABPROT, INR in the last 72 hours. ABG No results for input(s): PHART, HCO3 in the last 72 hours.  Invalid input(s): PCO2, PO2  Studies/Results: No results found.  Anti-infectives: Anti-infectives    Start     Dose/Rate Route Frequency Ordered Stop   06/30/15 1600  ceFAZolin (ANCEF) IVPB 2 g/50 mL premix     2 g 100 mL/hr over 30 Minutes Intravenous 3 times per day 06/30/15 1223 06/30/15 1622   06/30/15 0840  polymyxin B 500,000 Units, bacitracin 50,000 Units in sodium chloride irrigation 0.9 % 500 mL irrigation  Status:  Discontinued       As needed 06/30/15 0840 06/30/15 1029   06/30/15 0552  ceFAZolin (ANCEF) IVPB 2 g/50 mL premix     2 g 100 mL/hr over 30 Minutes Intravenous On call to O.R. 06/30/15 0552 06/30/15 0740      Assessment/Plan: s/p  Procedure(s): PANNICULECTOMY APPLICATION OF WOUND VAC HERNIA REPAIR VENTRAL ADULT WITH MESH INSERTION OF MESH  Removed 'homemade' incisional wound vac. Placed prevena incisional wound vac on at beside without complication.   Given lack of flatus, abd distension, I'm concerned may be developing an ileus. Think we need to keep her until she is at least passing flatus.  Started miralax, simethicone.  Cont chemical vte prophylaxis  Did phone f/u with nurse at 5pm - no flatus. Told her to advise pt she would not be dc today since no flatus.   [name] Andrey CampanileWilson, MD, FACS General, Bariatric, & Minimally Invasive Surgery [facility], [place]   LOS: 2 days    [name],Chania Kochanski M 07/02/2015

## 2015-07-02 NOTE — Progress Notes (Signed)
Patient complain of not passing gas and requested Miralax and simethicone. MD paged and received telephone order. Order placed. Patient continues to have trouble passing gas and will stay overnight. MD will reevaluate patient in AM. Will continue to encourage ambulation and continue to monitor.

## 2015-07-03 LAB — BASIC METABOLIC PANEL
ANION GAP: 8 (ref 5–15)
BUN: 13 mg/dL (ref 6–20)
CHLORIDE: 102 mmol/L (ref 101–111)
CO2: 30 mmol/L (ref 22–32)
Calcium: 8.5 mg/dL — ABNORMAL LOW (ref 8.9–10.3)
Creatinine, Ser: 0.54 mg/dL (ref 0.44–1.00)
GFR calc non Af Amer: >60 mL/min (ref >60)
Glucose, Bld: 100 mg/dL — ABNORMAL HIGH (ref 65–99)
Potassium: 3.9 mmol/L (ref 3.5–5.1)
Sodium: 140 mmol/L (ref 135–145)

## 2015-07-03 LAB — CBC
HCT: 31.6 % — ABNORMAL LOW (ref 36.0–46.0)
HEMOGLOBIN: 10.5 g/dL — AB (ref 12.0–15.0)
MCH: 30.5 pg (ref 26.0–34.0)
MCHC: 33.2 g/dL (ref 30.0–36.0)
MCV: 91.9 fL (ref 78.0–100.0)
Platelets: 330 10*3/uL (ref 150–400)
RBC: 3.44 MIL/uL — AB (ref 3.87–5.11)
RDW: 13.2 % (ref 11.5–15.5)
WBC: 8 10*3/uL (ref 4.0–10.5)

## 2015-07-03 MED ORDER — OXYCODONE HCL 5 MG PO TABS: 5.0000 mg | ORAL_TABLET | ORAL | Status: AC | PRN

## 2015-07-03 NOTE — Progress Notes (Signed)
Discharge instructions given to patient along with prescription.  Home prevena applied

## 2015-07-03 NOTE — Discharge Summary (Signed)
Physician Discharge Summary  Leslie Cohen ZOX:[id number] DOB: [birth year] DOA: 06/30/2015  PCP: Ignatius Specking., MD  Admit date: 06/30/2015 Discharge date: 07/03/2015  Recommendations for Outpatient Follow-up:   Follow-up Information    Follow up with Atilano Ina, MD. Schedule an appointment as soon as possible for a visit in 12 days.   Specialty:  General Surgery   Why:  For suture removal   Contact information:   [address] [place] [address] [phone]      Discharge Diagnoses:  Incisional hernia H/o laparoscopic sleeve gastrectomy  Surgical Procedure: open retro-rectus repair of incisional hernia with mesh, panniculectomy, placement of incisional wound vac  Discharge Condition: good Disposition: home  Diet recommendation: bariatric regular  Filed Weights   06/30/15 0613  Weight: 73.936 kg (163 lb)   Hospital Course:  39 year old female came in for a planned open ventral incisional hernia repair. She had initially presented a year ago with a ventral incisional hernia. Given her morbid obesity and comorbidities she underwent laparoscopic sleeve gastrectomy surgery in the spring of this year. She has done quite well. She has lost close to 80 pounds. However her ventral incisional hernia has become more symptomatic. She actually has had 2 distinct episodes of incarceration so we went ahead and worked her up and got her scheduled for hernia repair. Please see operative note for further details. She did well after surgery. On postoperative day 2 I changed out her incisional wound VAC to a prevena incisional wound VAC and she was instructed on usage of that by the sales rep. I did not feel she was stable for discharge on postop day 2 since she was bloated and had not had flatus. She was started on a bowel regimen. On postoperative day 3 she was having flatus and was no longer distended. She was tolerating a diet. She was ambulating. Her vital signs are stable.  Her wound was clean, dry, and intact. Her pain was controlled on oral medication. I felt she was stable for discharge. I discussed discharge instructions with the patient. The wound VAC a battery will last approximately 7 days at 7 days she is to remove the wound VAC.  BP 103/53 mmHg  Pulse 72  Temp(Src) 98.1 F (36.7 C) (Oral)  Resp 17  Ht  (1.549 m)  Wt 73.936 kg (163 lb)  BMI 30.81 kg/m2  SpO2 94%  LMP 06/19/2015 (Approximate)  Gen: alert, NAD, non-toxic appearing Pupils: equal, no scleral icterus Pulm: Lungs clear to auscultation, symmetric chest rise CV: regular rate and rhythm Abd: soft, appropriate mild tenderness in lower abdomen, nondistended. No cellulitis. No incisional hernia. Wound vac intact Ext: no edema, no calf tenderness Skin: no rash, no jaundice    Discharge Instructions  Discharge Instructions    Call MD for:  difficulty breathing, headache or visual disturbances    Complete by:  As directed      Call MD for:  persistant nausea and vomiting    Complete by:  As directed      Call MD for:  redness, tenderness, or signs of infection (pain, swelling, redness, odor or green/yellow discharge around incision site)    Complete by:  As directed      Call MD for:  severe uncontrolled pain    Complete by:  As directed      Call MD for:    Complete by:  As directed   Temp >101     Diet general  Complete by:  As directed   Bariatric     Discharge wound care:    Complete by:  As directed   Keep prevena wound vac on. Remove Prevena wound vac when battery runs out in 6-7 days.     Increase activity slowly    Complete by:  As directed      Lifting restrictions    Complete by:  As directed   No lifting, pushing, or pulling anything greater than 10 pounds for 6 weeks            Medication List    TAKE these medications        CALCIUM CITRATE + PO  Take 1 tablet by mouth 3 (three) times daily.     cetirizine 10 MG tablet  Commonly known as:  ZYRTEC   Take 10 mg by mouth daily as needed for allergies.     FLINTSTONES COMPLETE PO  Take 2 tablets by mouth daily.     oxyCODONE 5 MG immediate release tablet  Commonly known as:  Oxy IR/ROXICODONE  Take 1-2 tablets (5-10 mg total) by mouth every 4 (four) hours as needed for moderate pain.     polyethylene glycol packet  Commonly known as:  MIRALAX / GLYCOLAX  Take 17 g by mouth daily.     simethicone 125 MG chewable tablet  Commonly known as:  MYLICON  Chew 125 mg by mouth every 6 (six) hours as needed for flatulence.           Follow-up Information    Follow up with [name],Ayjah Show M, MD. Schedule an appointment as soon as possible for a visit in 12 days.   Specialty:  General Surgery   Why:  For suture removal   Contact information:   [address] [place] [address] [phone]        The results of significant diagnostics from this hospitalization (including imaging, microbiology, ancillary and laboratory) are listed below for reference.    Significant Diagnostic Studies: No results found.  Microbiology: No results found for this or any previous visit (from the past 240 hour(s)).   Labs: Basic Metabolic Panel:  Recent Labs Lab 07/01/15 0547 07/02/15 0528 07/03/15 0536  NA 140 140 140  K 4.7 4.0 3.9  CL 104 104 102  CO2 32 30 30  GLUCOSE 123* 89 100*  BUN 7 15 13   CREATININE 0.54 0.49 0.54  CALCIUM 8.5* 8.4* 8.5*   Liver Function Tests: No results for input(s): AST, ALT, ALKPHOS, BILITOT, PROT, ALBUMIN in the last 168 hours. No results for input(s): LIPASE, AMYLASE in the last 168 hours. No results for input(s): AMMONIA in the last 168 hours. CBC:  Recent Labs Lab 07/01/15 0547 07/01/15 1414 07/02/15 0528 07/03/15 0536  WBC 11.2*  --  8.2 8.0  HGB 10.6* 11.3* 10.1* 10.5*  HCT 32.2* 34.9* 30.4* 31.6*  MCV 91.2  --  91.3 91.9  PLT 323  --  294 330   Cardiac Enzymes: No results for input(s): CKTOTAL, CKMB, CKMBINDEX, TROPONINI in  the last 168 hours. BNP: BNP (last 3 results) No results for input(s): BNP in the last 8760 hours.  ProBNP (last 3 results) No results for input(s): PROBNP in the last 8760 hours.  CBG: No results for input(s): GLUCAP in the last 168 hours.  Active Problems:   Incisional hernia   Time coordinating discharge: 15 minutes  Signed:  Atilano InaEric M Elwood Bazinet, MD [facility] [place] [phone] 07/03/2015,  8:24 AM

## 2015-07-03 NOTE — Discharge Instructions (Signed)
[facility]  UMBILICAL OR INGUINAL HERNIA REPAIR: POST OP INSTRUCTIONS  Always review your discharge instruction sheet given to you by the facility where your surgery was performed. IF YOU HAVE DISABILITY OR FAMILY LEAVE FORMS, YOU MUST BRING THEM TO THE OFFICE FOR PROCESSING.   DO NOT GIVE THEM TO YOUR DOCTOR.  1. A  prescription for pain medication may be given to you upon discharge.  Take your pain medication as prescribed, if needed.  If narcotic pain medicine is not needed, then you may take acetaminophen (Tylenol) or ibuprofen (Advil) as needed. 2. Take your usually prescribed medications unless otherwise directed. 3. If you need a refill on your pain medication, please contact your pharmacy.  They will contact our office to request authorization. Prescriptions will not be filled after 5 pm or on week-ends. 4. You should follow a light diet the first 24 hours after arrival home, such as soup and crackers, etc.  Be sure to include lots of fluids daily.  Resume your normal diet the day after surgery. 5. Most patients will experience some swelling and bruising around the umbilicus or in the groin and scrotum.  Ice packs and reclining will help.  Swelling and bruising can take several days to resolve.  6. It is common to experience some constipation if taking pain medication after surgery.  Increasing fluid intake and taking a stool softener (such as Colace) will usually help or prevent this problem from occurring.  A mild laxative (Milk of Magnesia or Miralax) should be taken according to package directions if there are no bowel movements after 48 hours. 7.   Any sutures or staples will be removed at the office during your follow-up visit. 8. ACTIVITIES:  You may resume regular (light) daily activities beginning the next day--such as daily self-care, walking, climbing stairs--gradually increasing activities as tolerated.  You may have sexual intercourse when it is comfortable.   Refrain from any heavy lifting or straining until approved by your doctor. a. You may drive when you are no longer taking prescription pain medication, you can comfortably wear a seatbelt, and you can safely maneuver your car and apply brakes. b. RETURN TO WORK:  9. You should see your doctor in the office for a follow-up appointment approximately 2-3 weeks after your surgery.  Make sure that you call for this appointment within a day or two after you arrive home to insure a convenient appointment time. 10. OTHER INSTRUCTIONS: WEAR ABDOMINAL BINDER DURING DAYTIME, follow instructions on usage of Prevena incisional wound vac. Remove wound vac when battery runs out. Call with questions.    WHEN TO CALL YOUR DOCTOR: 1. Fever over 101.0 2. Inability to urinate 3. Nausea and/or vomiting 4. Extreme swelling or bruising 5. Continued bleeding from incision. 6. Increased pain, redness, or drainage from the incision  The clinic staff is available to answer your questions during regular business hours.  Please dont hesitate to call and ask to speak to one of the nurses for clinical concerns.  If you have a medical emergency, go to the nearest emergency room or call 911.  A surgeon from [facility] is always on call at the hospital   [address], [address], [place], [place]  [address] ?  [address] [address], [place], [place]   [address] [phone] ? [phone] ? FAX [phone] Web site: [email or web address]

## 2015-07-13 MED FILL — METHOCARBAMOL 500 MG TABLET: 500 | 10 days supply | Qty: 40 | Fill #0

## 2015-08-04 DIAGNOSIS — K912 Postsurgical malabsorption, not elsewhere classified: Secondary | ICD-10-CM | POA: Diagnosis not present

## 2015-08-24 DIAGNOSIS — R5383 Other fatigue: Secondary | ICD-10-CM | POA: Diagnosis not present

## 2015-08-24 DIAGNOSIS — Z01419 Encounter for gynecological examination (general) (routine) without abnormal findings: Secondary | ICD-10-CM | POA: Diagnosis not present

## 2015-08-24 DIAGNOSIS — Z79899 Other long term (current) drug therapy: Secondary | ICD-10-CM | POA: Diagnosis not present

## 2015-08-24 DIAGNOSIS — Z0000 Encounter for general adult medical examination without abnormal findings: Secondary | ICD-10-CM | POA: Diagnosis not present

## 2015-09-02 DIAGNOSIS — K912 Postsurgical malabsorption, not elsewhere classified: Secondary | ICD-10-CM | POA: Diagnosis not present

## 2015-09-27 DIAGNOSIS — R51 Headache: Secondary | ICD-10-CM | POA: Diagnosis not present

## 2015-09-27 DIAGNOSIS — J029 Acute pharyngitis, unspecified: Secondary | ICD-10-CM | POA: Diagnosis not present

## 2015-09-27 DIAGNOSIS — Z713 Dietary counseling and surveillance: Secondary | ICD-10-CM | POA: Diagnosis not present

## 2015-09-27 DIAGNOSIS — Z6829 Body mass index (BMI) 29.0-29.9, adult: Secondary | ICD-10-CM | POA: Diagnosis not present

## 2015-10-02 DIAGNOSIS — K912 Postsurgical malabsorption, not elsewhere classified: Secondary | ICD-10-CM | POA: Diagnosis not present

## 2015-10-05 DIAGNOSIS — M5441 Lumbago with sciatica, right side: Secondary | ICD-10-CM | POA: Diagnosis not present

## 2015-10-05 DIAGNOSIS — M4125 Other idiopathic scoliosis, thoracolumbar region: Secondary | ICD-10-CM | POA: Diagnosis not present

## 2015-10-05 DIAGNOSIS — S134XXA Sprain of ligaments of cervical spine, initial encounter: Secondary | ICD-10-CM | POA: Diagnosis not present

## 2015-10-12 DIAGNOSIS — M4125 Other idiopathic scoliosis, thoracolumbar region: Secondary | ICD-10-CM | POA: Diagnosis not present

## 2015-10-12 DIAGNOSIS — S134XXA Sprain of ligaments of cervical spine, initial encounter: Secondary | ICD-10-CM | POA: Diagnosis not present

## 2015-10-12 DIAGNOSIS — M5441 Lumbago with sciatica, right side: Secondary | ICD-10-CM | POA: Diagnosis not present

## 2015-10-28 ENCOUNTER — Other Ambulatory Visit: Payer: Self-pay

## 2015-10-28 NOTE — Patient Outreach (Signed)
UMR screening: Reviewed chart. Placed call to patient for high risk screening. No answer. Unidentified machine. No message left.  Plan: Will continue to outreach patient. Rowe PavyAmanda Abuk Selleck, RN, BSN, CEN [facility] Coordinator [phone]

## 2015-10-29 ENCOUNTER — Other Ambulatory Visit: Payer: Self-pay

## 2015-10-29 NOTE — Patient Outreach (Signed)
UMR  screeing: Place 2nd call to patient and she answered. Identified herself. Explained purpose of care. Patient reports that she has been doing well unitl 2 days ago when she bent over and felt a "popping out area in her stomach above the hernia repair".  States that she called her surgeon and has a follow up MD appointment on May 11th.  Patient reports that she is very disappointment about his potential new or reoccuring hernia.   Patient reports that she has lost 100 pounds after her gastric sleeve and is feeling great.  Reports the gastric sleeve has changed her life.    Reviewed benefits of [place] with outpatient pharmacy and ability to have a case manager if needed. Patient reports her Hgb A1c is now 5.2.   Patient declines any needs at this time and is appreciative of call. I provided the office number for [facility] if patient has needs in the future.  PLAN: Will close case as patient denies any needs. Will send Iverson AlaminLaura Greeson an in basket to close.  Rowe PavyAmanda Najiyah Paris, RN, BSN, CEN [facility] Care Coordinator [phone]

## 2015-11-01 DIAGNOSIS — K912 Postsurgical malabsorption, not elsewhere classified: Secondary | ICD-10-CM | POA: Diagnosis not present

## 2015-11-02 ENCOUNTER — Encounter: Payer: 59 | Attending: Internal Medicine | Admitting: Dietician

## 2015-11-02 ENCOUNTER — Encounter: Payer: Self-pay | Admitting: Dietician

## 2015-11-02 DIAGNOSIS — Z9884 Bariatric surgery status: Secondary | ICD-10-CM | POA: Diagnosis not present

## 2015-11-02 DIAGNOSIS — Z09 Encounter for follow-up examination after completed treatment for conditions other than malignant neoplasm: Secondary | ICD-10-CM | POA: Insufficient documentation

## 2015-11-02 NOTE — Progress Notes (Signed)
  Follow-up visit: 12 Months Post-Operative Sleeve Gastrectomy Surgery  Medical Nutrition Therapy:  Appt start time: 755 end time:  830  Primary concerns today: Post-operative Bariatric Surgery Nutrition Management. Returns with a 11 weight loss. Had a mini panniculectomy and hernia repair in December. The surgery was very painful and to take a lot pain medication. Just found another hernia and will see Dr. Andrey CampanileWilson next week. Wearing size small. Doesn't want to lose more weight but would like to tone up. Craves raw cookie dough. Did not tolerate bread. Does not eat rice, pasta, bread, or potatoes which she used to eat a lot of.   Has had trouble exercising since surgery in December. Gets between 9000-10000 steps in per day. Has been working 50-60 hours per week. Eats "better" on working days.   Overall feeling good.  Works 3rd shift.  Surgery date: 11/02/14 Surgery type: Gastric sleeve [facility]: 243.5 lbs on 08/24/14 Weight today: 155.0 lbs  Weight change: 11 lbs Total weight loss: 88.5 lbs  TANITA  BODY COMP RESULTS  10/19/14 11/17/14 12/28/14 02/02/15 05/04/15 11/02/15   BMI (kg/m^2) 45.6 43.0 38.7 37.0 31.4 29.3   Fat Mass (lbs) 121.5 108.5 97.0 79.0 61.5 51.5   Fat Free Mass (lbs) 120 119.0 108.0 117.0 104.5 103.5   Total Body Water (lbs) 88 87.0 79.0 85.5 76.5 76.0    Preferred Learning Style:   No preference indicated   Learning Readiness:   Ready  24-hr recall: B (8 AM): 2 eggs with cheese sometimes with 1 oz meat (18-25g) Snk (AM): sleeping L (4-6 PM): 1-2 oz meat with 1/4 cup refried beans with cheese or vegetables/salad (7-18 g) Snk (10-11PM): yogurt greek whipped or cheese (9-11 g) Snk (1 AM): 2-3 oz tuna/fish with spoonful of ranch (14-21 g) Snk (3-4 AM): cheese or chicken/tuna salad or yogurt and cheese (6-12 g) And one Premier Protein every other day if she has eaten enough (30 g)  Fluid intake: 30-40 oz water with crystal light or mio, 11 oz 15 calorie  lemonade sometimes and 11 oz protein shake sometimes, 24 oz coffee with flavored creamer and splenda Estimated total protein intake:   70-100g  Medications: see list Supplementation: taking   Using straws: No Drinking while eating: No Hair loss: No Carbonated beverages: very little, sips to make her burb N/V/D/C: No Dumping syndrome: if she has too much cookie dough (2 x)  Recent physical activity: walks 9000-10000 steps at work  Progress Towards Goal(s):  In progress.  Handouts given during visit include:  none   Nutritional Diagnosis:  [place]-3.3 Overweight/obesity related to past poor dietary habits and physical inactivity as evidenced by patient w/ recent sleeve gastrectomy surgery following dietary guidelines for continued weight loss.    Intervention:  Nutrition education/diet advancment. Goals:  Follow Phase 3B: High Protein + Non-Starchy Vegetables  Eat 3-6 small meals/snacks, every 3-5 hrs  Increase lean protein foods to meet 60g goal  Increase fluid intake to 64oz +   Avoid drinking 15 minutes before, during and 30 minutes after eating  Aim for >30 min of physical activity daily  Eat protein first, then vegetable, and then if there is room sometimes have bread or fruit  Teaching Method Utilized:  Visual Auditory Hands on  Barriers to learning/adherence to lifestyle change: none  Demonstrated degree of understanding via:  Teach Back   Monitoring/Evaluation:  Dietary intake, exercise, and body weight. Follow up prn

## 2015-11-02 NOTE — Patient Instructions (Addendum)
Goals:  Follow Phase 3B: High Protein + Non-Starchy Vegetables  Eat 3-6 small meals/snacks, every 3-5 hrs  Increase lean protein foods to meet 60g goal  Increase fluid intake to 64oz +   Avoid drinking 15 minutes before, during and 30 minutes after eating  Aim for >30 min of physical activity daily  Eat protein first, then vegetable, and then if there is room sometimes have carbs  Surgery date: 11/02/14 Surgery type: Gastric sleeve [facility] 243.5 lbs on 08/24/14 Weight today: 155.0 lbs  Weight change: 11 lbs Total weight loss: 88.5 lbs  TANITA  BODY COMP RESULTS  10/19/14 11/17/14 12/28/14 02/02/15 05/04/15 11/02/15   BMI (kg/m^2) 45.6 43.0 38.7 37.0 31.4 29.3   Fat Mass (lbs) 121.5 108.5 97.0 79.0 61.5 51.5   Fat Free Mass (lbs) 120 119.0 108.0 117.0 104.5 103.5   Total Body Water (lbs) 88 87.0 79.0 85.5 76.5 76.0

## 2015-11-17 ENCOUNTER — Other Ambulatory Visit: Payer: Self-pay | Admitting: General Surgery

## 2015-11-17 DIAGNOSIS — Z9889 Other specified postprocedural states: Secondary | ICD-10-CM | POA: Diagnosis not present

## 2015-11-17 DIAGNOSIS — Z9884 Bariatric surgery status: Secondary | ICD-10-CM | POA: Diagnosis not present

## 2015-11-17 DIAGNOSIS — E663 Overweight: Secondary | ICD-10-CM | POA: Diagnosis not present

## 2015-11-17 DIAGNOSIS — R1900 Intra-abdominal and pelvic swelling, mass and lump, unspecified site: Secondary | ICD-10-CM

## 2015-11-17 DIAGNOSIS — Z8719 Personal history of other diseases of the digestive system: Secondary | ICD-10-CM | POA: Diagnosis not present

## 2015-11-25 ENCOUNTER — Inpatient Hospital Stay: Admission: RE | Admit: 2015-11-25 | Payer: 59 | Source: Ambulatory Visit

## 2015-12-09 IMAGING — RF DG UGI W/ GASTROGRAFIN
14 of 16 series · 14 of 16 positions shown · IV contrast (omnipaque)
Comparison: 09/07/2014.

CLINICAL DATA: 38-year-old female status post sleeve gastrectomy
and hernia repair yesterday.

EXAM:
WATER SOLUBLE UPPER GI SERIES
TECHNIQUE: Single-column upper GI series was performed using water soluble
contrast.
CONTRAST:  20mL OMNIPAQUE IOHEXOL 300 MG/ML  SOLN

[Series 1: run · 1 of 1 slices shown (1 of 13)]
[im 1/1]
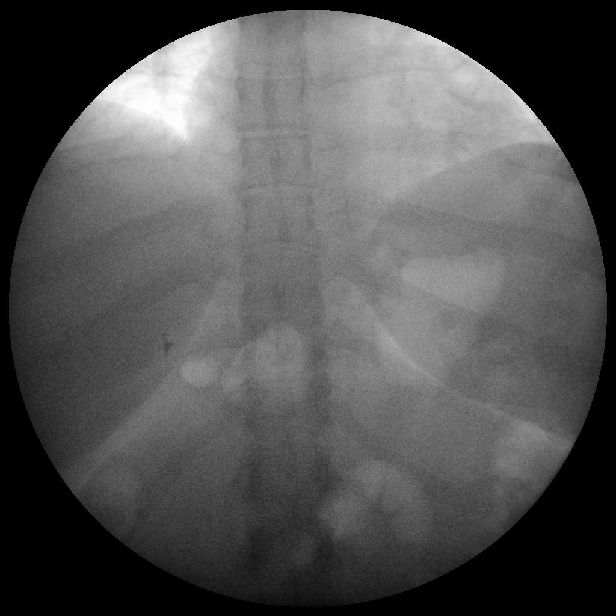

[Series 2: run · 1 of 1 slices shown (2 of 13)]
[im 1/1]
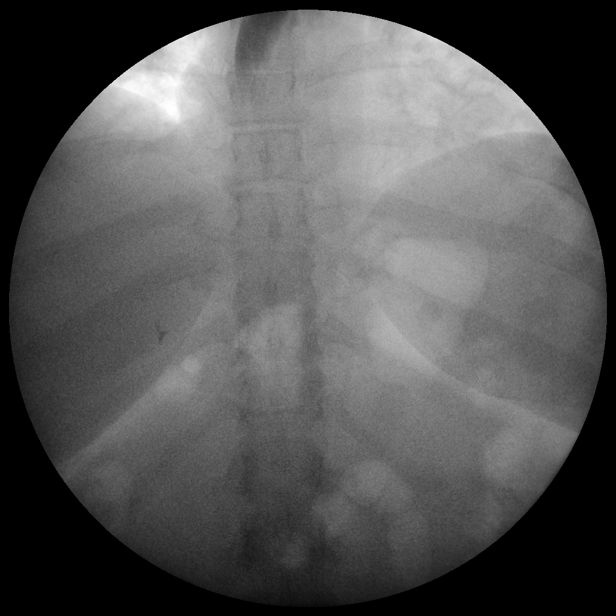

[Series 3: run · 1 of 1 slices shown (3 of 13)]
[im 1/1]
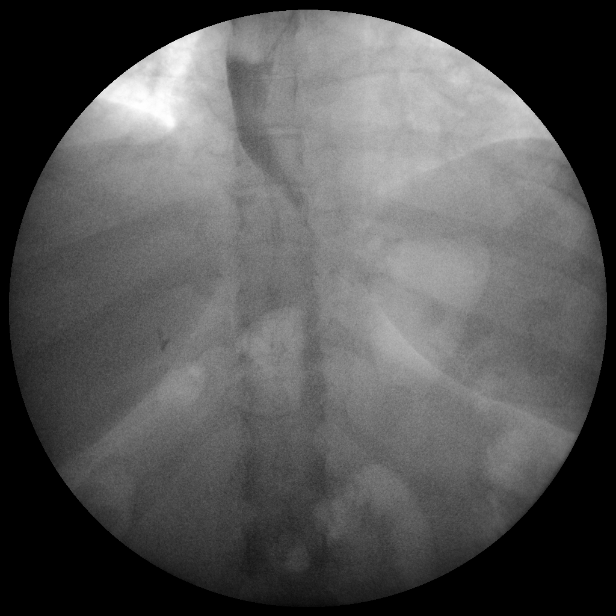

[Series 5: run · 1 of 1 slices shown (4 of 13)]
[im 1/1]
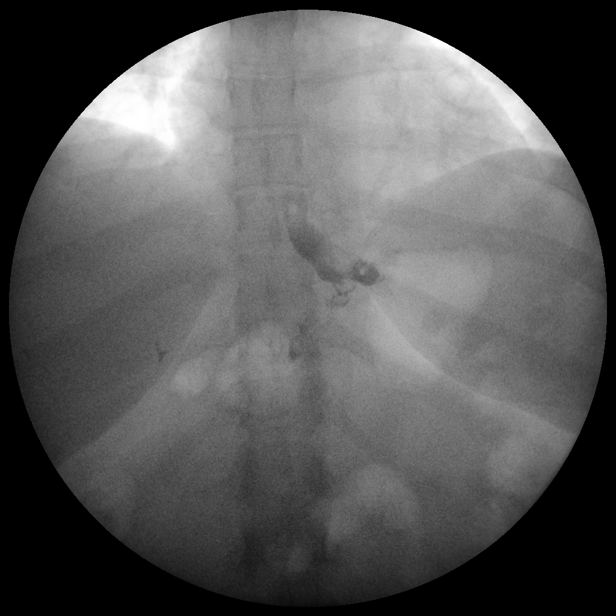

[Series 6: run · 1 of 1 slices shown (5 of 13)]
[im 1/1]
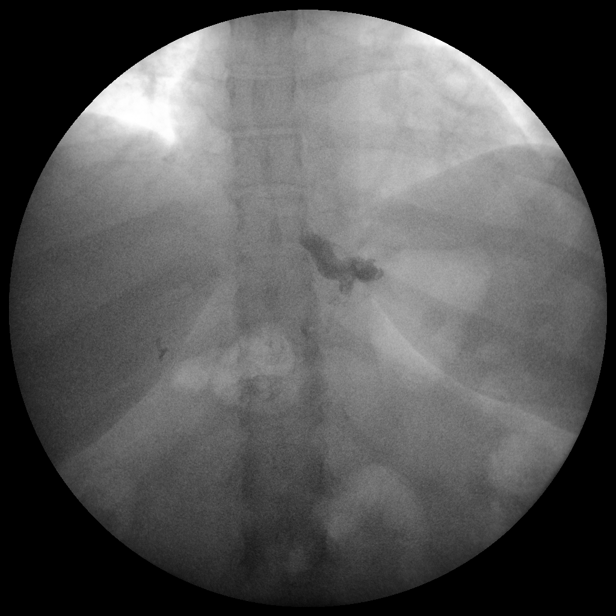

[Series 7: run · 1 of 1 slices shown (6 of 13)]
[im 1/1]
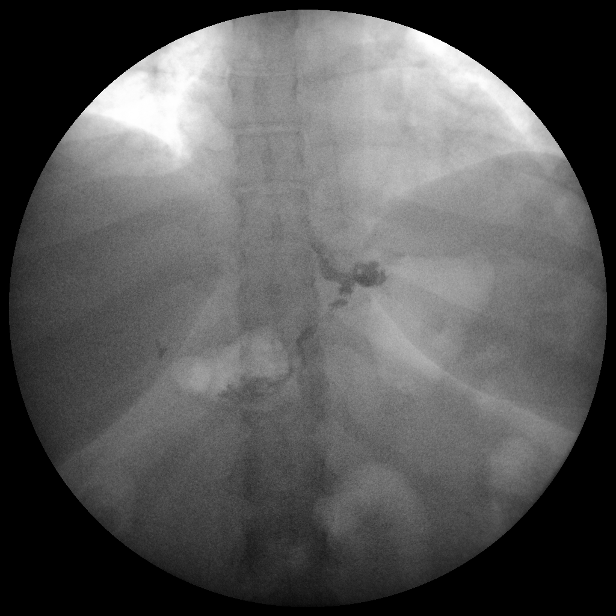

[Series 8: run · 1 of 1 slices shown (7 of 13)]
[im 1/1]
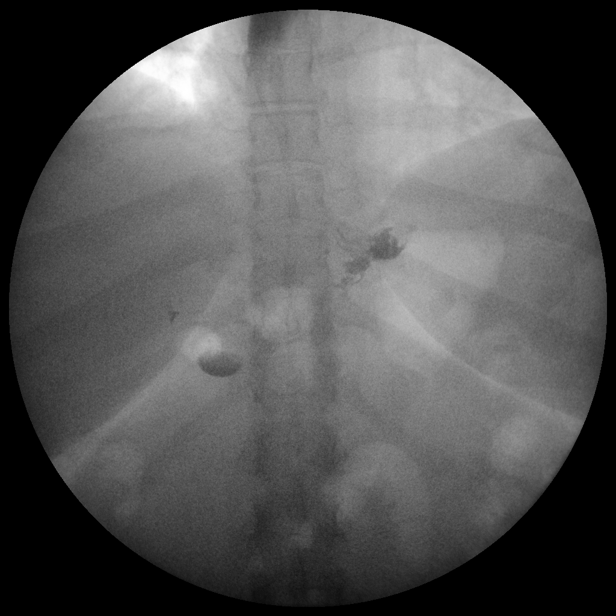

[Series 9: run · 1 of 1 slices shown (8 of 13)]
[im 1/1]
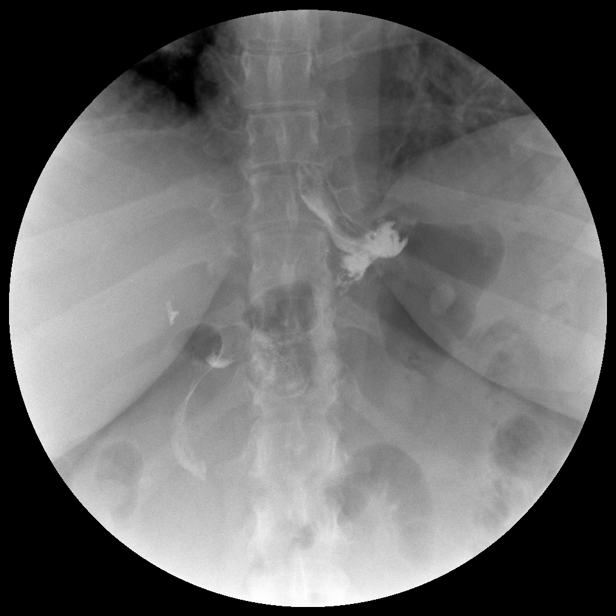

[Series 10: run · 1 of 1 slices shown (9 of 13)]
[im 1/1]
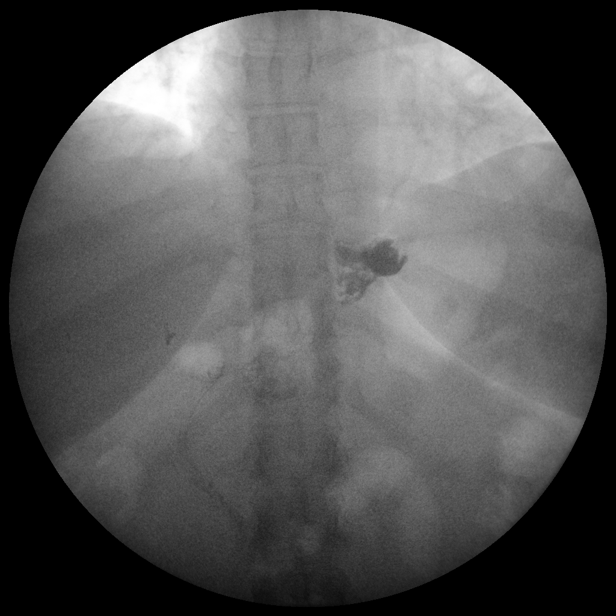

[Series 11: run · 1 of 1 slices shown (10 of 13)]
[im 1/1]
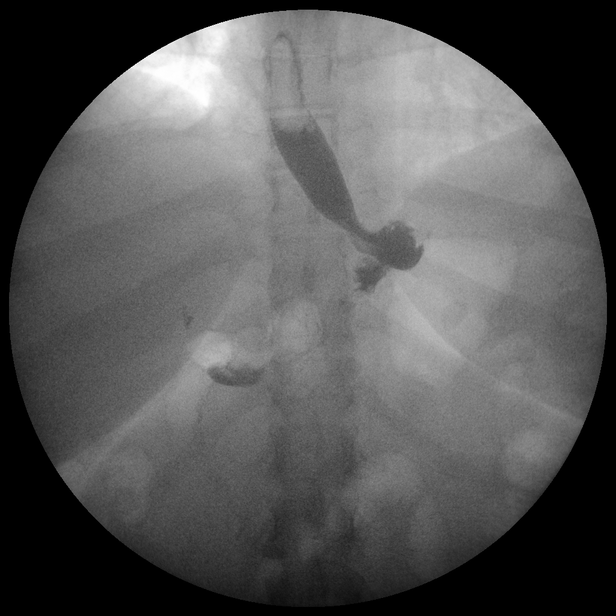

[Series 13: run · 1 of 1 slices shown (11 of 13)]
[im 1/1]
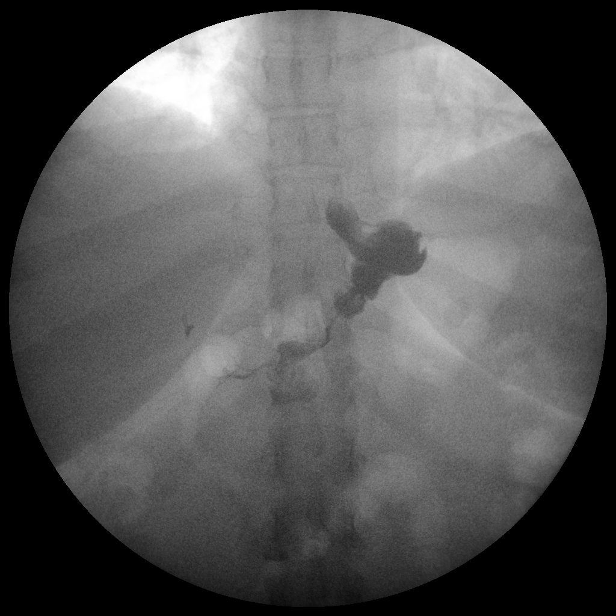

[Series 14: run · 1 of 1 slices shown (12 of 13)]
[im 1/1]
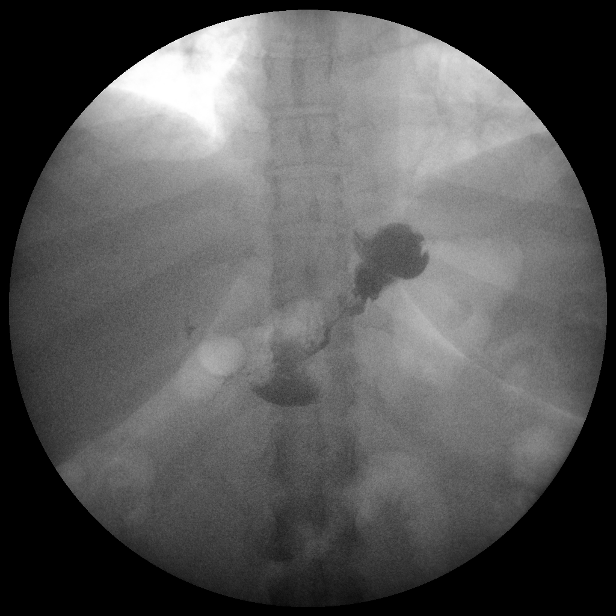

[Series 15: run · 1 of 1 slices shown (13 of 13)]
[im 1/1]
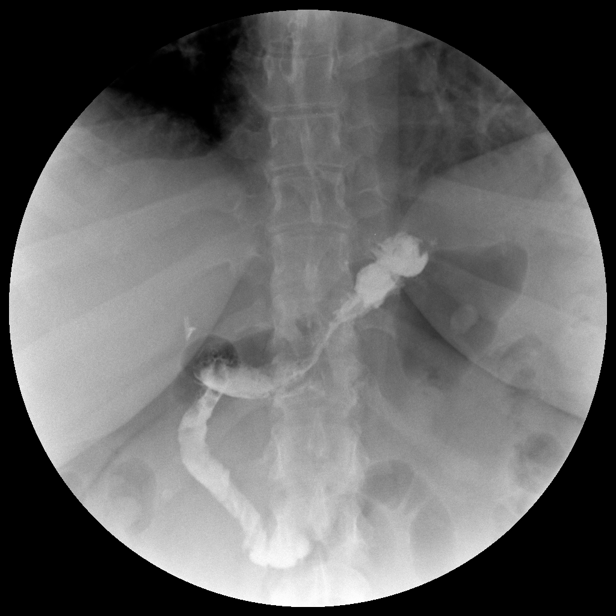

[Series 1001: view not recorded · 0.20mm/px · 1 of 1 slices shown]
[im 1/1]
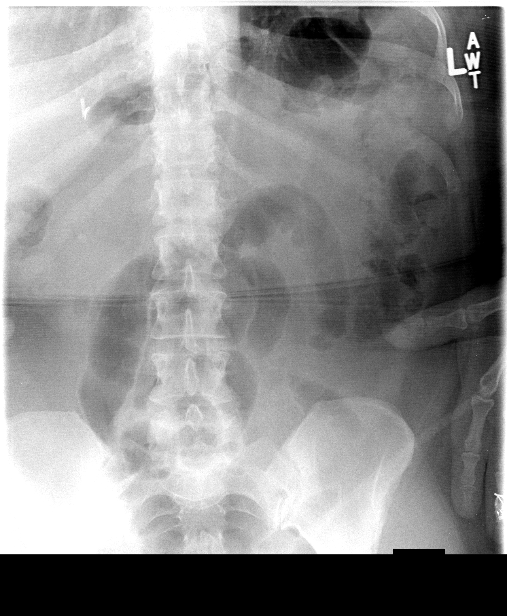

[14 of 16 positions shown; findings below may reference images not displayed]

FLUOROSCOPY TIME:  If the device does not provide the exposure
index:

Fluoroscopy Time (in minutes and seconds):  1 minutes and 4 seconds.

Number of Acquired Images:  16
FINDINGS: Preprocedure KUB demonstrates several nondilated gas-filled loops in
the central abdomen. Some gas is also noted throughout the colon.
Surgical clips in the right upper quadrant related to prior
cholecystectomy. 6 mm calcification projecting over the interpolar
region of the right kidney, compatible with a nonobstructive
calculus.

Subsequently, following ingestion of Omnipaque, the stomach was
opacified, demonstrating a narrowed gastric lumen typical of sleeve
gastrectomy. No extravasation of contrast material was noted at any
point during the examination. Contrast readily traversed the pylorus
into the proximal small bowel.
IMPRESSION: 1. Expected postoperative appearance following sleeve gastrectomy,
as above, without acute complicating features.

## 2016-01-14 ENCOUNTER — Other Ambulatory Visit: Payer: 59

## 2016-01-28 ENCOUNTER — Inpatient Hospital Stay: Admission: RE | Admit: 2016-01-28 | Payer: 59 | Source: Ambulatory Visit

## 2016-05-31 DIAGNOSIS — M5441 Lumbago with sciatica, right side: Secondary | ICD-10-CM | POA: Diagnosis not present

## 2016-05-31 DIAGNOSIS — M4125 Other idiopathic scoliosis, thoracolumbar region: Secondary | ICD-10-CM | POA: Diagnosis not present

## 2016-05-31 DIAGNOSIS — S134XXA Sprain of ligaments of cervical spine, initial encounter: Secondary | ICD-10-CM | POA: Diagnosis not present

## 2016-08-25 DIAGNOSIS — Z1231 Encounter for screening mammogram for malignant neoplasm of breast: Secondary | ICD-10-CM | POA: Diagnosis not present

## 2016-08-25 DIAGNOSIS — Z299 Encounter for prophylactic measures, unspecified: Secondary | ICD-10-CM | POA: Diagnosis not present

## 2016-08-25 DIAGNOSIS — Z6832 Body mass index (BMI) 32.0-32.9, adult: Secondary | ICD-10-CM | POA: Diagnosis not present

## 2016-08-25 DIAGNOSIS — Z713 Dietary counseling and surveillance: Secondary | ICD-10-CM | POA: Diagnosis not present

## 2016-08-25 DIAGNOSIS — Z1389 Encounter for screening for other disorder: Secondary | ICD-10-CM | POA: Diagnosis not present

## 2016-08-25 DIAGNOSIS — Z01419 Encounter for gynecological examination (general) (routine) without abnormal findings: Secondary | ICD-10-CM | POA: Diagnosis not present

## 2016-08-25 DIAGNOSIS — Z0000 Encounter for general adult medical examination without abnormal findings: Secondary | ICD-10-CM | POA: Diagnosis not present

## 2016-08-25 DIAGNOSIS — F329 Major depressive disorder, single episode, unspecified: Secondary | ICD-10-CM | POA: Diagnosis not present

## 2016-08-25 DIAGNOSIS — F419 Anxiety disorder, unspecified: Secondary | ICD-10-CM | POA: Diagnosis not present

## 2016-08-28 MED FILL — VIT D2 1.25 MG (50,000 UNIT: 1.25 MG | 28 days supply | Qty: 8 | Fill #0

## 2016-09-21 MED FILL — VIT D2 1.25 MG (50,000 UNIT: 1.25 MG | 28 days supply | Qty: 8 | Fill #1

## 2016-12-15 MED FILL — VIT D2 1.25 MG (50,000 UNIT: 1.25 MG | 28 days supply | Qty: 8 | Fill #2

## 2017-03-27 MED FILL — VIT D2 1.25 MG (50,000 UNIT: 1.25 MG | 28 days supply | Qty: 8 | Fill #3

## 2017-06-29 MED FILL — VIT D2 1.25 MG (50,000 UNIT: 1.25 MG | 28 days supply | Qty: 8 | Fill #4

## 2017-10-16 ENCOUNTER — Other Ambulatory Visit (HOSPITAL_COMMUNITY): Payer: Self-pay | Admitting: Physician Assistant

## 2017-10-16 DIAGNOSIS — Z1231 Encounter for screening mammogram for malignant neoplasm of breast: Secondary | ICD-10-CM

## 2017-10-31 MED FILL — CLOBETASOL PROPIONATE 0.05: 0.05 | 10 days supply | Qty: 60 | Fill #0

## 2017-10-31 MED FILL — valACYclovir HCL 1 GM TABS: 1 | 7 days supply | Qty: 21 | Fill #0

## 2017-10-31 MED FILL — predniSONE 10 MG TABS: 10 | 11 days supply | Qty: 8 | Fill #0

## 2017-11-29 ENCOUNTER — Other Ambulatory Visit: Payer: Self-pay | Admitting: General Surgery

## 2017-11-29 DIAGNOSIS — K432 Incisional hernia without obstruction or gangrene: Secondary | ICD-10-CM

## 2017-12-10 ENCOUNTER — Ambulatory Visit
Admission: RE | Admit: 2017-12-10 | Discharge: 2017-12-10 | Disposition: A | Discharge status: Home / Self Care | Payer: No Typology Code available for payment source | Source: Ambulatory Visit | Attending: General Surgery | Admitting: General Surgery

## 2017-12-10 DIAGNOSIS — K432 Incisional hernia without obstruction or gangrene: Secondary | ICD-10-CM

## 2017-12-10 MED ORDER — IOHEXOL 300 MG/ML  SOLN
30.0000 mL | Freq: Once | INTRAMUSCULAR | Status: AC | PRN
  Administered 2017-12-10: 30 mL via ORAL

## 2017-12-10 MED ORDER — IOHEXOL 300 MG/ML  SOLN
100.0000 mL | Freq: Once | INTRAMUSCULAR | Status: AC | PRN
  Administered 2017-12-10: 100 mL via INTRAVENOUS

## 2018-04-01 MED FILL — traMADol HCL 50 MG TABS: 50 | 8 days supply | Qty: 30 | Fill #0

## 2018-04-01 MED FILL — IBUPROFEN 400 MG TABS: 400 | 7 days supply | Qty: 30 | Fill #0

## 2018-04-01 MED FILL — hydrOXYzine HCL 25 MG TABS: 25 | 7 days supply | Qty: 25 | Fill #0

## 2018-04-01 MED FILL — GABAPENTIN 300 MG CAPSULE: 300 | 30 days supply | Qty: 90 | Fill #0

## 2018-04-01 MED FILL — oxyCODONE HCL 5 MG TABS: 5 | 7 days supply | Qty: 18 | Fill #0

## 2018-04-05 ENCOUNTER — Encounter: Payer: Self-pay | Admitting: *Deleted

## 2018-04-05 ENCOUNTER — Other Ambulatory Visit: Payer: Self-pay | Admitting: *Deleted

## 2018-04-05 NOTE — Patient Outreach (Signed)
[facility]) Care Management  04/05/2018  Leslie Cohen 07-14-1975 [id number]   Subjective:  Telephone call to patient's home / mobile number, spoke with patient, and HIPAA verified.  Discussed [facility] Care Management UMR Transition of care follow up, patient voiced understanding, and is in agreement to follow up.  Patient states she is doing great, currently at a restaurant waiting for dinner, having no pain, slight soreness around drains (has 2 drains), and first stitch of surgical site.  States surgery went well, surgeon used local anesthetic in additional to anesthesia, and she is very pleasured with her lack of pain.  States she was hospitalized 03/29/18 -04/01/18 for incisional hernia, status post hernia repair, at [facility]  States she has a follow up appointment with surgeon on 04/15/18.  States her primary provider is Armed forces logistics/support/administrative officer in [place].  Patient states she is able to manage self care and has assistance as needed.  Patient voices understanding of medical diagnosis, surgery,  and treatment plan. States she is accessing the following [facility] benefits: outpatient pharmacy, hospital indemnity (has filed claim on line, has downloaded discharge summary, will follow up on claim process), and has been approved for  family medical leave act (FMLA).   Patient states she does not have any education material, transition of care, care coordination, disease management, disease monitoring, transportation, community resource, or pharmacy needs at this time. States she is very appreciative of the follow up and is in agreement to receive [facility] Care Management information.     Objective: Per KPN (Knowledge Performance Now, point of care tool) and chart review, patient hospitalized at [facility] on 03/28/18 for incisional hernia, and unknown discharge date.   Patient also has a history of prediabetes and asthma.     Assessment: Received  Focus Plan Transition of care referral on 04/05/18.  Transition of care follow up completed, no care management needs, and will proceed with case closure.       Plan: RNCM will send patient successful outreach letter, [facility] pamphlet, and magnet. RNCM will complete case closure due to follow up completed / no care management needs.  RNCM will send request to Iverson Alamin at [facility] Care Management to change patient's primary provider in Epic to: [facility] Physician Assistant in [place].       Sudiksha Victor H. Gardiner Barefoot, BSN, CCM [facility] Care Management [facility] Telephonic CM Phone: [phone] Fax: [phone]

## 2018-05-01 MED FILL — GABAPENTIN 300 MG CAPSULE: 300 | 30 days supply | Qty: 90 | Fill #0

## 2019-07-08 MED FILL — PHENTERMINE 37.5 MG TABLET: 37.5 | 30 days supply | Qty: 30 | Fill #0

## 2020-02-10 ENCOUNTER — Other Ambulatory Visit (HOSPITAL_COMMUNITY): Payer: Self-pay | Admitting: Physician Assistant

## 2020-02-10 DIAGNOSIS — Z1231 Encounter for screening mammogram for malignant neoplasm of breast: Secondary | ICD-10-CM

## 2020-02-17 ENCOUNTER — Other Ambulatory Visit: Payer: Self-pay

## 2020-02-17 ENCOUNTER — Ambulatory Visit (HOSPITAL_COMMUNITY): Payer: No Typology Code available for payment source

## 2020-02-17 ENCOUNTER — Ambulatory Visit (HOSPITAL_COMMUNITY)
Admission: RE | Admit: 2020-02-17 | Discharge: 2020-02-17 | Disposition: A | Discharge status: Home / Self Care | Payer: No Typology Code available for payment source | Source: Ambulatory Visit | Attending: Physician Assistant | Admitting: Physician Assistant

## 2020-02-17 DIAGNOSIS — Z1231 Encounter for screening mammogram for malignant neoplasm of breast: Secondary | ICD-10-CM | POA: Diagnosis not present

## 2020-02-18 ENCOUNTER — Other Ambulatory Visit (HOSPITAL_COMMUNITY): Payer: Self-pay | Admitting: Physician Assistant

## 2020-02-25 ENCOUNTER — Other Ambulatory Visit (HOSPITAL_COMMUNITY): Payer: Self-pay | Admitting: Physician Assistant

## 2020-02-25 DIAGNOSIS — R928 Other abnormal and inconclusive findings on diagnostic imaging of breast: Secondary | ICD-10-CM

## 2020-03-09 ENCOUNTER — Ambulatory Visit (HOSPITAL_COMMUNITY)
Admission: RE | Admit: 2020-03-09 | Discharge: 2020-03-09 | Disposition: A | Discharge status: Home / Self Care | Payer: No Typology Code available for payment source | Source: Ambulatory Visit | Attending: Physician Assistant | Admitting: Physician Assistant

## 2020-03-09 ENCOUNTER — Other Ambulatory Visit: Payer: Self-pay

## 2020-03-09 DIAGNOSIS — R928 Other abnormal and inconclusive findings on diagnostic imaging of breast: Secondary | ICD-10-CM | POA: Insufficient documentation

## 2021-04-15 ENCOUNTER — Other Ambulatory Visit (HOSPITAL_COMMUNITY): Payer: Self-pay

## 2021-04-15 MED ORDER — MOUNJARO 2.5 MG/0.5ML ~~LOC~~ SOAJ
2.5000 mg | SUBCUTANEOUS | 0 refills | Status: DC
  Filled 2021-04-15: qty 2, 28d supply, fill #0

## 2021-05-04 ENCOUNTER — Other Ambulatory Visit (HOSPITAL_COMMUNITY): Payer: Self-pay

## 2021-05-04 MED ORDER — CLONAZEPAM 0.5 MG PO TABS
0.5000 mg | ORAL_TABLET | Freq: Every evening | ORAL | 0 refills | Status: AC
  Filled 2021-05-04 – 2021-05-23 (×2): qty 30, 30d supply, fill #0

## 2021-05-04 MED ORDER — BUPROPION HCL ER (XL) 150 MG PO TB24
150.0000 mg | ORAL_TABLET | Freq: Every day | ORAL | 0 refills | Status: DC
  Filled 2021-05-09: qty 60, 60d supply, fill #0

## 2021-05-04 MED ORDER — TIRZEPATIDE 2.5 MG/0.5ML ~~LOC~~ SOAJ
2.5000 mg | SUBCUTANEOUS | 0 refills | Status: AC
  Filled 2021-05-04: qty 2, 28d supply, fill #0

## 2021-05-05 ENCOUNTER — Other Ambulatory Visit (HOSPITAL_COMMUNITY): Payer: Self-pay

## 2021-05-07 ENCOUNTER — Other Ambulatory Visit (HOSPITAL_COMMUNITY): Payer: Self-pay

## 2021-05-09 ENCOUNTER — Other Ambulatory Visit (HOSPITAL_COMMUNITY): Payer: Self-pay

## 2021-05-17 ENCOUNTER — Other Ambulatory Visit (HOSPITAL_COMMUNITY): Payer: Self-pay

## 2021-05-19 ENCOUNTER — Other Ambulatory Visit (HOSPITAL_COMMUNITY): Payer: Self-pay

## 2021-05-23 ENCOUNTER — Other Ambulatory Visit (HOSPITAL_COMMUNITY): Payer: Self-pay

## 2021-05-28 ENCOUNTER — Other Ambulatory Visit (HOSPITAL_COMMUNITY): Payer: Self-pay

## 2021-05-28 MED ORDER — MOUNJARO 2.5 MG/0.5ML ~~LOC~~ SOAJ
SUBCUTANEOUS | 0 refills | Status: DC
  Filled 2021-05-28: qty 6, 84d supply, fill #0

## 2021-05-28 MED ORDER — CLONAZEPAM 1 MG PO TABS
1.0000 mg | ORAL_TABLET | ORAL | 0 refills | Status: AC
  Filled 2021-05-28 – 2021-06-16 (×3): qty 30, 30d supply, fill #0

## 2021-05-30 ENCOUNTER — Other Ambulatory Visit (HOSPITAL_COMMUNITY): Payer: Self-pay

## 2021-06-13 ENCOUNTER — Other Ambulatory Visit (HOSPITAL_COMMUNITY): Payer: Self-pay

## 2021-06-16 ENCOUNTER — Other Ambulatory Visit (HOSPITAL_COMMUNITY): Payer: Self-pay

## 2021-06-17 ENCOUNTER — Other Ambulatory Visit (HOSPITAL_COMMUNITY): Payer: Self-pay

## 2021-06-18 ENCOUNTER — Other Ambulatory Visit (HOSPITAL_COMMUNITY): Payer: Self-pay

## 2021-07-13 ENCOUNTER — Other Ambulatory Visit (HOSPITAL_COMMUNITY): Payer: Self-pay

## 2021-07-13 MED ORDER — CLONAZEPAM 1 MG PO TABS
ORAL_TABLET | ORAL | 0 refills | Status: AC
  Filled 2021-10-11: qty 30, 30d supply, fill #0

## 2021-07-13 MED ORDER — CLONAZEPAM 1 MG PO TABS
ORAL_TABLET | ORAL | 0 refills | Status: AC
  Filled 2021-09-02: qty 30, 30d supply, fill #0

## 2021-07-13 MED ORDER — BUPROPION HCL ER (XL) 150 MG PO TB24
ORAL_TABLET | ORAL | 0 refills | Status: DC
  Filled 2021-07-13: qty 90, 90d supply, fill #0

## 2021-07-13 MED ORDER — CLONAZEPAM 1 MG PO TABS
ORAL_TABLET | ORAL | 0 refills | Status: AC
  Filled 2021-07-13: qty 30, 30d supply, fill #0

## 2021-07-20 ENCOUNTER — Other Ambulatory Visit (HOSPITAL_COMMUNITY): Payer: Self-pay

## 2021-07-20 MED ORDER — METRONIDAZOLE 0.75 % VA GEL
VAGINAL | 0 refills | Status: AC
  Filled 2021-07-20: qty 70, 5d supply, fill #0

## 2021-08-08 ENCOUNTER — Other Ambulatory Visit (HOSPITAL_COMMUNITY): Payer: Self-pay

## 2021-08-08 MED ORDER — MOUNJARO 5 MG/0.5ML ~~LOC~~ SOAJ
SUBCUTANEOUS | 0 refills | Status: AC
  Filled 2021-08-08: qty 2, 30d supply, fill #0

## 2021-09-02 ENCOUNTER — Other Ambulatory Visit (HOSPITAL_COMMUNITY): Payer: Self-pay

## 2021-09-02 MED ORDER — MOUNJARO 5 MG/0.5ML ~~LOC~~ SOAJ
SUBCUTANEOUS | 0 refills | Status: AC
  Filled 2021-09-02: qty 6, 84d supply, fill #0

## 2021-10-03 ENCOUNTER — Other Ambulatory Visit (HOSPITAL_COMMUNITY): Payer: Self-pay

## 2021-10-03 MED ORDER — CLONAZEPAM 1 MG PO TABS
ORAL_TABLET | ORAL | 0 refills | Status: AC
  Filled 2022-01-02: qty 30, 30d supply, fill #0

## 2021-10-03 MED ORDER — CLONAZEPAM 1 MG PO TABS
ORAL_TABLET | ORAL | 0 refills | Status: AC
  Filled 2021-11-21: qty 30, 30d supply, fill #0

## 2021-10-03 MED ORDER — BUPROPION HCL ER (XL) 150 MG PO TB24
ORAL_TABLET | ORAL | 0 refills | Status: AC
  Filled 2021-10-03: qty 90, 90d supply, fill #0

## 2021-10-03 MED ORDER — CLONAZEPAM 1 MG PO TABS
ORAL_TABLET | ORAL | 0 refills | Status: DC
  Filled 2022-02-06: qty 30, 30d supply, fill #0

## 2021-10-11 ENCOUNTER — Other Ambulatory Visit (HOSPITAL_COMMUNITY): Payer: Self-pay

## 2021-11-12 ENCOUNTER — Other Ambulatory Visit (HOSPITAL_COMMUNITY): Payer: Self-pay

## 2021-11-12 MED ORDER — PREDNISONE 50 MG PO TABS
50.0000 mg | ORAL_TABLET | Freq: Every day | ORAL | 0 refills | Status: AC
  Filled 2021-11-12: qty 5, 5d supply, fill #0

## 2021-11-12 MED ORDER — HYDROXYZINE PAMOATE 25 MG PO CAPS
25.0000 mg | ORAL_CAPSULE | Freq: Three times a day (TID) | ORAL | 0 refills | Status: AC | PRN
  Filled 2021-11-12: qty 60, 20d supply, fill #0

## 2021-11-12 MED ORDER — TRIAMCINOLONE ACETONIDE 0.1 % EX CREA
TOPICAL_CREAM | CUTANEOUS | 0 refills | Status: AC
  Filled 2021-11-12: qty 60, 14d supply, fill #0

## 2021-11-21 ENCOUNTER — Other Ambulatory Visit (HOSPITAL_COMMUNITY): Payer: Self-pay

## 2022-01-02 ENCOUNTER — Other Ambulatory Visit (HOSPITAL_COMMUNITY): Payer: Self-pay

## 2022-01-09 ENCOUNTER — Other Ambulatory Visit (HOSPITAL_COMMUNITY): Payer: Self-pay

## 2022-01-09 MED ORDER — CLONAZEPAM 0.5 MG PO TABS
ORAL_TABLET | ORAL | 0 refills | Status: AC
Start: 2022-02-08 — End: ?
  Filled 2022-03-24: qty 30, 30d supply, fill #0

## 2022-01-09 MED ORDER — CLONAZEPAM 0.5 MG PO TABS: ORAL_TABLET | ORAL | 0 refills | Status: DC

## 2022-01-09 MED ORDER — PANTOPRAZOLE SODIUM 40 MG PO TBEC
DELAYED_RELEASE_TABLET | ORAL | 0 refills | Status: DC
Start: 2022-01-09 — End: 2022-03-20
  Filled 2022-01-09: qty 90, 90d supply, fill #0

## 2022-01-09 MED ORDER — BUPROPION HCL ER (XL) 150 MG PO TB24
ORAL_TABLET | ORAL | 0 refills | Status: DC
Start: 2022-01-09 — End: 2022-04-04
  Filled 2022-01-09: qty 90, 90d supply, fill #0

## 2022-01-09 MED ORDER — CLONAZEPAM 0.5 MG PO TABS
ORAL_TABLET | ORAL | 0 refills | Status: AC
Start: 2022-01-09 — End: ?
  Filled 2022-01-09: qty 30, 30d supply, fill #0

## 2022-02-06 ENCOUNTER — Other Ambulatory Visit (HOSPITAL_COMMUNITY): Payer: Self-pay

## 2022-02-27 ENCOUNTER — Other Ambulatory Visit (HOSPITAL_COMMUNITY): Payer: Self-pay | Admitting: Physician Assistant

## 2022-02-27 DIAGNOSIS — Z1231 Encounter for screening mammogram for malignant neoplasm of breast: Secondary | ICD-10-CM

## 2022-03-09 ENCOUNTER — Ambulatory Visit (HOSPITAL_COMMUNITY)
Admission: RE | Admit: 2022-03-09 | Discharge: 2022-03-09 | Disposition: A | Discharge status: Home / Self Care | Payer: No Typology Code available for payment source | Source: Ambulatory Visit | Attending: Physician Assistant | Admitting: Physician Assistant

## 2022-03-09 DIAGNOSIS — Z1231 Encounter for screening mammogram for malignant neoplasm of breast: Secondary | ICD-10-CM | POA: Insufficient documentation

## 2022-03-20 ENCOUNTER — Other Ambulatory Visit (HOSPITAL_COMMUNITY): Payer: Self-pay

## 2022-03-20 MED ORDER — PANTOPRAZOLE SODIUM 40 MG PO TBEC
40.0000 mg | DELAYED_RELEASE_TABLET | Freq: Every day | ORAL | 0 refills | Status: AC
  Filled 2022-03-24: qty 90, 90d supply, fill #0

## 2022-03-22 ENCOUNTER — Other Ambulatory Visit (HOSPITAL_COMMUNITY): Payer: Self-pay

## 2022-03-24 ENCOUNTER — Other Ambulatory Visit (HOSPITAL_COMMUNITY): Payer: Self-pay

## 2022-04-04 ENCOUNTER — Other Ambulatory Visit (HOSPITAL_COMMUNITY): Payer: Self-pay

## 2022-04-04 MED ORDER — PHENTERMINE HCL 37.5 MG PO TABS
37.5000 mg | ORAL_TABLET | ORAL | 0 refills | Status: AC
  Filled 2022-04-04: qty 30, 30d supply, fill #0

## 2022-04-04 MED ORDER — CLONAZEPAM 1 MG PO TABS
1.0000 mg | ORAL_TABLET | Freq: Every evening | ORAL | 0 refills | Status: AC
  Filled 2022-04-04: qty 90, 90d supply, fill #0

## 2022-04-04 MED ORDER — BUPROPION HCL ER (XL) 150 MG PO TB24
150.0000 mg | ORAL_TABLET | Freq: Every day | ORAL | 0 refills | Status: AC
  Filled 2022-04-04: qty 90, 90d supply, fill #0

## 2022-06-28 ENCOUNTER — Other Ambulatory Visit (HOSPITAL_COMMUNITY): Payer: Self-pay

## 2022-07-06 ENCOUNTER — Other Ambulatory Visit (HOSPITAL_COMMUNITY): Payer: Self-pay

## 2022-07-06 DIAGNOSIS — Z20828 Contact with and (suspected) exposure to other viral communicable diseases: Secondary | ICD-10-CM | POA: Diagnosis not present

## 2022-07-07 ENCOUNTER — Other Ambulatory Visit (HOSPITAL_COMMUNITY): Payer: Self-pay

## 2022-07-07 MED ORDER — LAGEVRIO 200 MG PO CAPS
4.0000 | ORAL_CAPSULE | Freq: Two times a day (BID) | ORAL | 0 refills | Status: AC
  Filled 2022-07-07: qty 40, 5d supply, fill #0

## 2022-07-18 DIAGNOSIS — F4323 Adjustment disorder with mixed anxiety and depressed mood: Secondary | ICD-10-CM | POA: Diagnosis not present

## 2022-07-19 ENCOUNTER — Other Ambulatory Visit: Payer: Self-pay

## 2022-07-19 ENCOUNTER — Other Ambulatory Visit (HOSPITAL_COMMUNITY): Payer: Self-pay

## 2022-07-19 DIAGNOSIS — Z6826 Body mass index (BMI) 26.0-26.9, adult: Secondary | ICD-10-CM | POA: Diagnosis not present

## 2022-07-19 DIAGNOSIS — E6609 Other obesity due to excess calories: Secondary | ICD-10-CM | POA: Diagnosis not present

## 2022-07-19 DIAGNOSIS — R030 Elevated blood-pressure reading, without diagnosis of hypertension: Secondary | ICD-10-CM | POA: Diagnosis not present

## 2022-07-19 DIAGNOSIS — R7303 Prediabetes: Secondary | ICD-10-CM | POA: Diagnosis not present

## 2022-07-19 DIAGNOSIS — F419 Anxiety disorder, unspecified: Secondary | ICD-10-CM | POA: Diagnosis not present

## 2022-07-19 DIAGNOSIS — M7711 Lateral epicondylitis, right elbow: Secondary | ICD-10-CM | POA: Diagnosis not present

## 2022-07-19 DIAGNOSIS — Z0000 Encounter for general adult medical examination without abnormal findings: Secondary | ICD-10-CM | POA: Diagnosis not present

## 2022-07-19 MED ORDER — CLONAZEPAM 1 MG PO TABS: 1.0000 mg | ORAL_TABLET | Freq: Every day | ORAL | 0 refills | Status: AC

## 2022-07-19 MED ORDER — CLONAZEPAM 1 MG PO TABS
1.0000 mg | ORAL_TABLET | Freq: Every day | ORAL | 0 refills | Status: AC
  Filled 2022-07-19: qty 90, 90d supply, fill #0

## 2022-07-19 MED ORDER — HYDROXYZINE PAMOATE 25 MG PO CAPS
25.0000 mg | ORAL_CAPSULE | Freq: Every evening | ORAL | 0 refills | Status: AC
  Filled 2022-07-19: qty 90, 90d supply, fill #0

## 2022-07-19 MED ORDER — PANTOPRAZOLE SODIUM 40 MG PO TBEC
40.0000 mg | DELAYED_RELEASE_TABLET | Freq: Every day | ORAL | 0 refills | Status: AC
  Filled 2022-07-19: qty 90, 90d supply, fill #0

## 2022-07-19 MED ORDER — BUPROPION HCL ER (XL) 150 MG PO TB24
150.0000 mg | ORAL_TABLET | Freq: Every day | ORAL | 0 refills | Status: AC
  Filled 2022-07-19: qty 90, 90d supply, fill #0

## 2022-07-25 DIAGNOSIS — F4323 Adjustment disorder with mixed anxiety and depressed mood: Secondary | ICD-10-CM | POA: Diagnosis not present

## 2022-08-01 DIAGNOSIS — F4323 Adjustment disorder with mixed anxiety and depressed mood: Secondary | ICD-10-CM | POA: Diagnosis not present

## 2022-08-08 DIAGNOSIS — F4323 Adjustment disorder with mixed anxiety and depressed mood: Secondary | ICD-10-CM | POA: Diagnosis not present

## 2022-08-15 DIAGNOSIS — F4323 Adjustment disorder with mixed anxiety and depressed mood: Secondary | ICD-10-CM | POA: Diagnosis not present

## 2022-08-22 ENCOUNTER — Other Ambulatory Visit (HOSPITAL_COMMUNITY): Payer: Self-pay

## 2022-08-22 DIAGNOSIS — F4323 Adjustment disorder with mixed anxiety and depressed mood: Secondary | ICD-10-CM | POA: Diagnosis not present

## 2022-09-05 DIAGNOSIS — F4323 Adjustment disorder with mixed anxiety and depressed mood: Secondary | ICD-10-CM | POA: Diagnosis not present

## 2022-09-12 DIAGNOSIS — F4323 Adjustment disorder with mixed anxiety and depressed mood: Secondary | ICD-10-CM | POA: Diagnosis not present

## 2022-09-19 DIAGNOSIS — F4323 Adjustment disorder with mixed anxiety and depressed mood: Secondary | ICD-10-CM | POA: Diagnosis not present

## 2022-09-22 ENCOUNTER — Other Ambulatory Visit (HOSPITAL_COMMUNITY): Payer: Self-pay

## 2022-10-23 ENCOUNTER — Other Ambulatory Visit (HOSPITAL_COMMUNITY): Payer: Self-pay

## 2022-10-23 ENCOUNTER — Other Ambulatory Visit: Payer: Self-pay

## 2022-10-23 DIAGNOSIS — R030 Elevated blood-pressure reading, without diagnosis of hypertension: Secondary | ICD-10-CM | POA: Diagnosis not present

## 2022-10-23 DIAGNOSIS — F419 Anxiety disorder, unspecified: Secondary | ICD-10-CM | POA: Diagnosis not present

## 2022-10-23 DIAGNOSIS — Z6826 Body mass index (BMI) 26.0-26.9, adult: Secondary | ICD-10-CM | POA: Diagnosis not present

## 2022-10-23 MED ORDER — PANTOPRAZOLE SODIUM 40 MG PO TBEC
40.0000 mg | DELAYED_RELEASE_TABLET | Freq: Every day | ORAL | 0 refills | Status: AC
  Filled 2022-10-23: qty 90, 90d supply, fill #0

## 2022-10-23 MED ORDER — CLONAZEPAM 1 MG PO TABS: 1.0000 mg | ORAL_TABLET | Freq: Every day | ORAL | 0 refills | Status: AC

## 2022-10-23 MED ORDER — BUPROPION HCL ER (XL) 150 MG PO TB24
150.0000 mg | ORAL_TABLET | Freq: Every day | ORAL | 0 refills | Status: DC
  Filled 2022-10-23: qty 90, 90d supply, fill #0

## 2022-10-23 MED ORDER — CLONAZEPAM 1 MG PO TABS
1.0000 mg | ORAL_TABLET | Freq: Every day | ORAL | 0 refills | Status: AC
  Filled 2023-02-16: qty 90, 90d supply, fill #0

## 2022-10-23 MED ORDER — CLONAZEPAM 1 MG PO TABS
1.0000 mg | ORAL_TABLET | Freq: Every day | ORAL | 0 refills | Status: AC
  Filled 2022-10-23: qty 90, 90d supply, fill #0

## 2022-10-24 ENCOUNTER — Other Ambulatory Visit (HOSPITAL_COMMUNITY): Payer: Self-pay

## 2022-12-12 DIAGNOSIS — F4323 Adjustment disorder with mixed anxiety and depressed mood: Secondary | ICD-10-CM | POA: Diagnosis not present

## 2022-12-19 DIAGNOSIS — F4323 Adjustment disorder with mixed anxiety and depressed mood: Secondary | ICD-10-CM | POA: Diagnosis not present

## 2022-12-26 DIAGNOSIS — F4323 Adjustment disorder with mixed anxiety and depressed mood: Secondary | ICD-10-CM | POA: Diagnosis not present

## 2023-01-02 DIAGNOSIS — F4323 Adjustment disorder with mixed anxiety and depressed mood: Secondary | ICD-10-CM | POA: Diagnosis not present

## 2023-01-09 DIAGNOSIS — F4323 Adjustment disorder with mixed anxiety and depressed mood: Secondary | ICD-10-CM | POA: Diagnosis not present

## 2023-01-16 DIAGNOSIS — F4323 Adjustment disorder with mixed anxiety and depressed mood: Secondary | ICD-10-CM | POA: Diagnosis not present

## 2023-01-23 DIAGNOSIS — F4323 Adjustment disorder with mixed anxiety and depressed mood: Secondary | ICD-10-CM | POA: Diagnosis not present

## 2023-01-30 DIAGNOSIS — F4323 Adjustment disorder with mixed anxiety and depressed mood: Secondary | ICD-10-CM | POA: Diagnosis not present

## 2023-02-16 ENCOUNTER — Other Ambulatory Visit: Payer: Self-pay

## 2023-02-16 ENCOUNTER — Other Ambulatory Visit (HOSPITAL_COMMUNITY): Payer: Self-pay

## 2023-02-27 DIAGNOSIS — F4323 Adjustment disorder with mixed anxiety and depressed mood: Secondary | ICD-10-CM | POA: Diagnosis not present

## 2023-03-06 DIAGNOSIS — F4323 Adjustment disorder with mixed anxiety and depressed mood: Secondary | ICD-10-CM | POA: Diagnosis not present

## 2023-03-13 DIAGNOSIS — F4323 Adjustment disorder with mixed anxiety and depressed mood: Secondary | ICD-10-CM | POA: Diagnosis not present

## 2023-03-20 DIAGNOSIS — F4323 Adjustment disorder with mixed anxiety and depressed mood: Secondary | ICD-10-CM | POA: Diagnosis not present

## 2023-03-27 DIAGNOSIS — F4323 Adjustment disorder with mixed anxiety and depressed mood: Secondary | ICD-10-CM | POA: Diagnosis not present

## 2023-04-03 DIAGNOSIS — F4323 Adjustment disorder with mixed anxiety and depressed mood: Secondary | ICD-10-CM | POA: Diagnosis not present

## 2023-04-04 ENCOUNTER — Other Ambulatory Visit (HOSPITAL_COMMUNITY): Payer: Self-pay

## 2023-04-04 MED ORDER — BUPROPION HCL ER (XL) 150 MG PO TB24
150.0000 mg | ORAL_TABLET | Freq: Every day | ORAL | 0 refills | Status: AC
  Filled 2023-04-04 (×2): qty 90, 90d supply, fill #0

## 2023-04-10 DIAGNOSIS — F4323 Adjustment disorder with mixed anxiety and depressed mood: Secondary | ICD-10-CM | POA: Diagnosis not present

## 2023-04-17 DIAGNOSIS — F4323 Adjustment disorder with mixed anxiety and depressed mood: Secondary | ICD-10-CM | POA: Diagnosis not present

## 2023-04-25 DIAGNOSIS — F4323 Adjustment disorder with mixed anxiety and depressed mood: Secondary | ICD-10-CM | POA: Diagnosis not present

## 2023-08-07 ENCOUNTER — Other Ambulatory Visit (HOSPITAL_COMMUNITY): Payer: Self-pay

## 2023-08-29 DIAGNOSIS — Z13220 Encounter for screening for lipoid disorders: Secondary | ICD-10-CM | POA: Diagnosis not present

## 2023-08-29 DIAGNOSIS — R635 Abnormal weight gain: Secondary | ICD-10-CM | POA: Diagnosis not present

## 2023-08-29 DIAGNOSIS — Z113 Encounter for screening for infections with a predominantly sexual mode of transmission: Secondary | ICD-10-CM | POA: Diagnosis not present

## 2023-08-29 DIAGNOSIS — D649 Anemia, unspecified: Secondary | ICD-10-CM | POA: Diagnosis not present

## 2023-08-29 DIAGNOSIS — R5383 Other fatigue: Secondary | ICD-10-CM | POA: Diagnosis not present

## 2023-08-29 DIAGNOSIS — Z0000 Encounter for general adult medical examination without abnormal findings: Secondary | ICD-10-CM | POA: Diagnosis not present

## 2023-09-10 DIAGNOSIS — F4312 Post-traumatic stress disorder, chronic: Secondary | ICD-10-CM | POA: Diagnosis not present

## 2023-09-13 DIAGNOSIS — F4312 Post-traumatic stress disorder, chronic: Secondary | ICD-10-CM | POA: Diagnosis not present

## 2023-09-19 DIAGNOSIS — F4312 Post-traumatic stress disorder, chronic: Secondary | ICD-10-CM | POA: Diagnosis not present

## 2023-09-26 DIAGNOSIS — F4312 Post-traumatic stress disorder, chronic: Secondary | ICD-10-CM | POA: Diagnosis not present

## 2023-10-09 DIAGNOSIS — R87610 Atypical squamous cells of undetermined significance on cytologic smear of cervix (ASC-US): Secondary | ICD-10-CM | POA: Diagnosis not present

## 2023-10-09 DIAGNOSIS — Z0000 Encounter for general adult medical examination without abnormal findings: Secondary | ICD-10-CM | POA: Diagnosis not present

## 2023-11-27 DIAGNOSIS — F4312 Post-traumatic stress disorder, chronic: Secondary | ICD-10-CM | POA: Diagnosis not present

## 2023-12-11 DIAGNOSIS — F4312 Post-traumatic stress disorder, chronic: Secondary | ICD-10-CM | POA: Diagnosis not present

## 2023-12-25 DIAGNOSIS — F4312 Post-traumatic stress disorder, chronic: Secondary | ICD-10-CM | POA: Diagnosis not present

## 2024-01-08 DIAGNOSIS — F4312 Post-traumatic stress disorder, chronic: Secondary | ICD-10-CM | POA: Diagnosis not present

## 2024-01-24 DIAGNOSIS — F4312 Post-traumatic stress disorder, chronic: Secondary | ICD-10-CM | POA: Diagnosis not present

## 2024-02-05 DIAGNOSIS — F4312 Post-traumatic stress disorder, chronic: Secondary | ICD-10-CM | POA: Diagnosis not present

## 2024-03-04 DIAGNOSIS — F4312 Post-traumatic stress disorder, chronic: Secondary | ICD-10-CM | POA: Diagnosis not present

## 2024-03-18 DIAGNOSIS — F4312 Post-traumatic stress disorder, chronic: Secondary | ICD-10-CM | POA: Diagnosis not present

## 2024-03-27 ENCOUNTER — Other Ambulatory Visit (HOSPITAL_BASED_OUTPATIENT_CLINIC_OR_DEPARTMENT_OTHER): Payer: Self-pay

## 2024-03-27 MED ORDER — FLUZONE 0.5 ML IM SUSY
0.5000 mL | PREFILLED_SYRINGE | Freq: Once | INTRAMUSCULAR | 0 refills | Status: AC
  Filled 2024-03-27: qty 0.5, 1d supply, fill #0

## 2024-03-28 ENCOUNTER — Other Ambulatory Visit (HOSPITAL_BASED_OUTPATIENT_CLINIC_OR_DEPARTMENT_OTHER): Payer: Self-pay

## 2024-03-31 ENCOUNTER — Other Ambulatory Visit (HOSPITAL_BASED_OUTPATIENT_CLINIC_OR_DEPARTMENT_OTHER): Payer: Self-pay

## 2024-04-15 DIAGNOSIS — F411 Generalized anxiety disorder: Secondary | ICD-10-CM | POA: Diagnosis not present

## 2024-05-13 DIAGNOSIS — F4312 Post-traumatic stress disorder, chronic: Secondary | ICD-10-CM | POA: Diagnosis not present

## 2024-05-22 DIAGNOSIS — F4312 Post-traumatic stress disorder, chronic: Secondary | ICD-10-CM | POA: Diagnosis not present

## 2024-06-05 DIAGNOSIS — F411 Generalized anxiety disorder: Secondary | ICD-10-CM | POA: Diagnosis not present
